# Patient Record
Sex: Female | Born: 1990 | Race: Black or African American | Hispanic: No | Marital: Married | Smoking: Never smoker
Health system: Southern US, Community
[De-identification: ages and names within clinical notes are randomized; demographics above are authoritative.]

## PROBLEM LIST (undated history)

## (undated) ENCOUNTER — Inpatient Hospital Stay (HOSPITAL_COMMUNITY): Payer: Self-pay

## (undated) DIAGNOSIS — Z789 Other specified health status: Secondary | ICD-10-CM

## (undated) DIAGNOSIS — E119 Type 2 diabetes mellitus without complications: Secondary | ICD-10-CM

## (undated) HISTORY — PX: NO PAST SURGERIES: SHX2092

---
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
## 2008-03-12 ENCOUNTER — Emergency Department (HOSPITAL_COMMUNITY): Admission: EM | Admit: 2008-03-12 | Discharge: 2008-03-12 | Payer: Self-pay | Admitting: Emergency Medicine

## 2008-05-23 ENCOUNTER — Emergency Department (HOSPITAL_COMMUNITY): Admission: EM | Admit: 2008-05-23 | Discharge: 2008-05-23 | Payer: Self-pay | Admitting: Emergency Medicine

## 2009-02-13 ENCOUNTER — Emergency Department (HOSPITAL_COMMUNITY): Admission: EM | Admit: 2009-02-13 | Discharge: 2009-02-13 | Payer: Self-pay | Admitting: Family Medicine

## 2010-10-28 LAB — POCT PREGNANCY, URINE: Preg Test, Ur: NEGATIVE

## 2013-01-25 NOTE — L&D Delivery Note (Signed)
Delivery Note At 11:52 AM a viable female was delivered via  (Presentation: ;  ).  APGAR: , ; weight  .   Placenta status: , .  Cord:  with the following complications: .  Cord pH: not done  Anesthesia: None  Episiotomy:   Lacerations:   Suture Repair: 2.0 Est. Blood Loss (mL):    Mom to postpartum.  Baby to Couplet care / Skin to Skin.  Jann Ra A 01/14/2014, 12:02 PM

## 2013-08-15 LAB — OB RESULTS CONSOLE HIV ANTIBODY (ROUTINE TESTING): HIV: NONREACTIVE

## 2013-08-15 LAB — OB RESULTS CONSOLE RUBELLA ANTIBODY, IGM: RUBELLA: IMMUNE

## 2013-08-15 LAB — OB RESULTS CONSOLE GC/CHLAMYDIA
Chlamydia: NEGATIVE
GC PROBE AMP, GENITAL: NEGATIVE

## 2013-08-15 LAB — OB RESULTS CONSOLE ANTIBODY SCREEN: ANTIBODY SCREEN: NEGATIVE

## 2013-08-15 LAB — OB RESULTS CONSOLE RPR: RPR: NONREACTIVE

## 2013-08-15 LAB — OB RESULTS CONSOLE ABO/RH: RH Type: POSITIVE

## 2013-08-15 LAB — OB RESULTS CONSOLE HEPATITIS B SURFACE ANTIGEN: Hepatitis B Surface Ag: NEGATIVE

## 2013-12-17 LAB — OB RESULTS CONSOLE GBS: GBS: POSITIVE

## 2014-01-11 ENCOUNTER — Inpatient Hospital Stay (HOSPITAL_COMMUNITY)
Admission: AD | Admit: 2014-01-11 | Discharge: 2014-01-11 | Disposition: A | Discharge status: Home / Self Care | Payer: Medicaid Other | Source: Ambulatory Visit | Attending: Obstetrics | Admitting: Obstetrics

## 2014-01-11 ENCOUNTER — Encounter (HOSPITAL_COMMUNITY): Payer: Self-pay | Admitting: *Deleted

## 2014-01-11 DIAGNOSIS — O471 False labor at or after 37 completed weeks of gestation: Secondary | ICD-10-CM | POA: Insufficient documentation

## 2014-01-11 DIAGNOSIS — Z3A39 39 weeks gestation of pregnancy: Secondary | ICD-10-CM | POA: Diagnosis not present

## 2014-01-11 HISTORY — DX: Other specified health status: Z78.9

## 2014-01-11 NOTE — MAU Note (Signed)
Contractions all day. Closer now. Unsure if leaking fld or just peeing a lot.

## 2014-01-11 NOTE — Discharge Instructions (Signed)
Nifedipine capsules What is this medicine? NIFEDIPINE (nye FED i peen) is a calcium-channel blocker. It affects the amount of calcium found in your heart and muscle cells. This relaxes your blood vessels, which can reduce the amount of work the heart has to do. This medicine is used to treat chest pain caused by angina. This medicine may be used for other purposes; ask your health care provider or pharmacist if you have questions. COMMON BRAND NAME(S): Adalat, Procardia What should I tell my health care provider before I take this medicine? They need to know if you have any of these conditions: -heart problems, low blood pressure, slow or irregular heartbeat -kidney disease -liver disease -previous heart attack -an unusual or allergic reaction to nifedipine, other medicines, foods, dyes, or preservatives -pregnant or trying to get pregnant -breast-feeding How should I use this medicine? Take this medicine by mouth with a glass of water. Follow the directions on the prescription label. Swallow whole. Take your doses at regular intervals. Do not take your medicine more often then directed. Do not suddenly stop taking this medicine. Your doctor will tell you how much medicine to take. If your doctor wants you to stop the medicine, the dose will be slowly lowered over time to avoid any side effects. Talk to your pediatrician regarding the use of this medicine in children. Special care may be needed. Overdosage: If you think you have taken too much of this medicine contact a poison control center or emergency room at once. NOTE: This medicine is only for you. Do not share this medicine with others. What if I miss a dose? If you miss a dose, take it as soon as you can. If it is almost time for your next dose, take only that dose. Do not take double or extra doses. What may interact with this medicine? Do not take this medicine with any of the following medications: -certain medicines for seizures  like carbamazepine, phenobarbital, phenytoin -rifabutin -rifampin -rifapentine -St. John's Wort This medicine may also interact with the following medications: -antiviral medicines for HIV or AIDS -certain medicines for blood pressure -certain medicines for diabetes -certain medicines for erectile dysfunction -certain medicines for fungal infections like ketoconazole, fluconazole, and itraconazole -certain medicines for irregular heart beat like flecainide and quinidine -certain medicines that treat or prevent blood clots like warfarin -clarithromycin -digoxin -dolasetron -erythromycin -fluoxetine -grapefruit juice -local or general anesthetics -nefazodone -orlistat -quinupristin; dalfopristin -sirolimus -stomach acid blockers like cimetidine, ranitidine, omeprazole, or pantoprazole -tacrolimus -valproic acid This list may not describe all possible interactions. Give your health care provider a list of all the medicines, herbs, non-prescription drugs, or dietary supplements you use. Also tell them if you smoke, drink alcohol, or use illegal drugs. Some items may interact with your medicine. What should I watch for while using this medicine? Visit your doctor or health care professional for regular check ups. Check your blood pressure and pulse rate regularly. Ask your doctor or health care professional what your blood pressure and pulse rate should be and when you should contact him or her. You may get drowsy or dizzy. Do not drive, use machinery, or do anything that needs mental alertness until you know how this medicine affects you. Do not stand or sit up quickly, especially if you are an older patient. This reduces the risk of dizzy or fainting spells. Alcohol may interfere with the effect of this medicine. Avoid alcoholic drinks. What side effects may I notice from receiving this medicine? Side effects  that you should report to your doctor or health care professional as soon as  possible: -blood in the urine -difficulty breathing -fast heartbeat, palpitations, irregular heartbeat, chest pain -redness, blistering, peeling or loosening of the skin, including inside the mouth -reduced amount of urine passed -skin rash -swelling of the legs and ankles Side effects that usually do not require medical attention (report to your doctor or health care professional if they continue or are bothersome): -constipation -facial flushing -headache -weakness or tiredness This list may not describe all possible side effects. Call your doctor for medical advice about side effects. You may report side effects to FDA at 1-800-FDA-1088. Where should I keep my medicine? Keep out of the reach of children. Store at room temperature between 15 and 25 degrees C (59 and 77 degrees F). Protect from light and moisture. Keep container tightly closed. Throw away any unused medicine after the expiration date. NOTE: This sheet is a summary. It may not cover all possible information. If you have questions about this medicine, talk to your doctor, pharmacist, or health care provider.  2015, Elsevier/Gold Standard. (2008-04-03 13:09:21) Preterm Labor Information Preterm labor is when labor starts at less than 37 weeks of pregnancy. The normal length of a pregnancy is 39 to 41 weeks. CAUSES Often, there is no identifiable underlying cause as to why a woman goes into preterm labor. One of the most common known causes of preterm labor is infection. Infections of the uterus, cervix, vagina, amniotic sac, bladder, kidney, or even the lungs (pneumonia) can cause labor to start. Other suspected causes of preterm labor include:   Urogenital infections, such as yeast infections and bacterial vaginosis.   Uterine abnormalities (uterine shape, uterine septum, fibroids, or bleeding from the placenta).   A cervix that has been operated on (it may fail to stay closed).   Malformations in the fetus.    Multiple gestations (twins, triplets, and so on).   Breakage of the amniotic sac.  RISK FACTORS  Having a previous history of preterm labor.   Having premature rupture of membranes (PROM).   Having a placenta that covers the opening of the cervix (placenta previa).   Having a placenta that separates from the uterus (placental abruption).   Having a cervix that is too weak to hold the fetus in the uterus (incompetent cervix).   Having too much fluid in the amniotic sac (polyhydramnios).   Taking illegal drugs or smoking while pregnant.   Not gaining enough weight while pregnant.   Being younger than 8718 and older than 25 years old.   Having a low socioeconomic status.   Being African American. SYMPTOMS Signs and symptoms of preterm labor include:   Menstrual-like cramps, abdominal pain, or back pain.  Uterine contractions that are regular, as frequent as six in an hour, regardless of their intensity (may be mild or painful).  Contractions that start on the top of the uterus and spread down to the lower abdomen and back.   A sense of increased pelvic pressure.   A watery or bloody mucus discharge that comes from the vagina.  TREATMENT Depending on the length of the pregnancy and other circumstances, your health care provider may suggest bed rest. If necessary, there are medicines that can be given to stop contractions and to mature the fetal lungs. If labor happens before 34 weeks of pregnancy, a prolonged hospital stay may be recommended. Treatment depends on the condition of both you and the fetus.  WHAT SHOULD YOU  DO IF YOU THINK YOU ARE IN PRETERM LABOR? Call your health care provider right away. You will need to go to the hospital to get checked immediately. HOW CAN YOU PREVENT PRETERM LABOR IN FUTURE PREGNANCIES? You should:   Stop smoking if you smoke.  Maintain healthy weight gain and avoid chemicals and drugs that are not necessary.  Be  watchful for any type of infection.  Inform your health care provider if you have a known history of preterm labor. Document Released: 04/03/2003 Document Revised: 09/13/2012 Document Reviewed: 02/14/2012 [facility] Patient Information 2015 [place]. This information is not intended to replace advice given to you by your health care provider. Make sure you discuss any questions you have with your health care provider.

## 2014-01-14 ENCOUNTER — Inpatient Hospital Stay (HOSPITAL_COMMUNITY)
Admission: AD | Admit: 2014-01-14 | Discharge: 2014-01-16 | DRG: 775 | Disposition: A | Discharge status: Home / Self Care | Payer: Medicaid Other | Source: Ambulatory Visit | Attending: Obstetrics | Admitting: Obstetrics

## 2014-01-14 ENCOUNTER — Encounter (HOSPITAL_COMMUNITY): Payer: Self-pay

## 2014-01-14 DIAGNOSIS — O99820 Streptococcus B carrier state complicating pregnancy: Secondary | ICD-10-CM | POA: Diagnosis present

## 2014-01-14 DIAGNOSIS — Z3A40 40 weeks gestation of pregnancy: Secondary | ICD-10-CM | POA: Diagnosis present

## 2014-01-14 DIAGNOSIS — O480 Post-term pregnancy: Secondary | ICD-10-CM | POA: Diagnosis present

## 2014-01-14 DIAGNOSIS — O99824 Streptococcus B carrier state complicating childbirth: Secondary | ICD-10-CM | POA: Diagnosis present

## 2014-01-14 LAB — CBC
HCT: 30.6 % — ABNORMAL LOW (ref 36.0–46.0)
Hemoglobin: 9.7 g/dL — ABNORMAL LOW (ref 12.0–15.0)
MCH: 25.5 pg — ABNORMAL LOW (ref 26.0–34.0)
MCHC: 31.7 g/dL (ref 30.0–36.0)
MCV: 80.5 fL (ref 78.0–100.0)
PLATELETS: 235 10*3/uL (ref 150–400)
RBC: 3.8 MIL/uL — ABNORMAL LOW (ref 3.87–5.11)
RDW: 14.5 % (ref 11.5–15.5)
WBC: 8.4 10*3/uL (ref 4.0–10.5)

## 2014-01-14 LAB — RPR

## 2014-01-14 LAB — TYPE AND SCREEN
ABO/RH(D): O POS
Antibody Screen: NEGATIVE

## 2014-01-14 LAB — ABO/RH: ABO/RH(D): O POS

## 2014-01-14 MED ORDER — FENTANYL 2.5 MCG/ML BUPIVACAINE 1/10 % EPIDURAL INFUSION (WH - ANES): 14.0000 mL/h | INTRAMUSCULAR | Status: DC | PRN

## 2014-01-14 MED ORDER — CITRIC ACID-SODIUM CITRATE 334-500 MG/5ML PO SOLN: 30.0000 mL | ORAL | Status: DC | PRN

## 2014-01-14 MED ORDER — FERROUS SULFATE 325 (65 FE) MG PO TABS
325.0000 mg | ORAL_TABLET | Freq: Two times a day (BID) | ORAL | Status: DC
  Administered 2014-01-14 – 2014-01-15 (×2): 325 mg via ORAL
  Filled 2014-01-14 (×5): qty 1

## 2014-01-14 MED ORDER — ONDANSETRON HCL 4 MG/2ML IJ SOLN: 4.0000 mg | INTRAMUSCULAR | Status: DC | PRN

## 2014-01-14 MED ORDER — BENZOCAINE-MENTHOL 20-0.5 % EX AERO
1.0000 "application " | INHALATION_SPRAY | CUTANEOUS | Status: DC | PRN
  Filled 2014-01-14: qty 56

## 2014-01-14 MED ORDER — OXYCODONE-ACETAMINOPHEN 5-325 MG PO TABS: 2.0000 | ORAL_TABLET | ORAL | Status: DC | PRN

## 2014-01-14 MED ORDER — OXYTOCIN 40 UNITS IN LACTATED RINGERS INFUSION - SIMPLE MED
1.0000 m[IU]/min | INTRAVENOUS | Status: DC
  Administered 2014-01-14: 2 m[IU]/min via INTRAVENOUS

## 2014-01-14 MED ORDER — LACTATED RINGERS IV SOLN: 500.0000 mL | INTRAVENOUS | Status: DC | PRN

## 2014-01-14 MED ORDER — ACETAMINOPHEN 325 MG PO TABS: 650.0000 mg | ORAL_TABLET | ORAL | Status: DC | PRN

## 2014-01-14 MED ORDER — OXYTOCIN BOLUS FROM INFUSION
500.0000 mL | INTRAVENOUS | Status: DC
  Administered 2014-01-14: 500 mL via INTRAVENOUS

## 2014-01-14 MED ORDER — TERBUTALINE SULFATE 1 MG/ML IJ SOLN: 0.2500 mg | Freq: Once | INTRAMUSCULAR | Status: DC | PRN

## 2014-01-14 MED ORDER — PHENYLEPHRINE 40 MCG/ML (10ML) SYRINGE FOR IV PUSH (FOR BLOOD PRESSURE SUPPORT): 80.0000 ug | PREFILLED_SYRINGE | INTRAVENOUS | Status: DC | PRN

## 2014-01-14 MED ORDER — EPHEDRINE 5 MG/ML INJ: 10.0000 mg | INTRAVENOUS | Status: DC | PRN

## 2014-01-14 MED ORDER — OXYCODONE-ACETAMINOPHEN 5-325 MG PO TABS
1.0000 | ORAL_TABLET | ORAL | Status: DC | PRN
  Administered 2014-01-15: 1 via ORAL
  Filled 2014-01-14: qty 1

## 2014-01-14 MED ORDER — IBUPROFEN 600 MG PO TABS
600.0000 mg | ORAL_TABLET | Freq: Four times a day (QID) | ORAL | Status: DC
  Administered 2014-01-14 – 2014-01-16 (×8): 600 mg via ORAL
  Filled 2014-01-14 (×9): qty 1

## 2014-01-14 MED ORDER — LACTATED RINGERS IV SOLN
INTRAVENOUS | Status: DC
  Administered 2014-01-14: 05:00:00 via INTRAVENOUS

## 2014-01-14 MED ORDER — DIBUCAINE 1 % RE OINT
1.0000 "application " | TOPICAL_OINTMENT | RECTAL | Status: DC | PRN
  Filled 2014-01-14: qty 28

## 2014-01-14 MED ORDER — BUTORPHANOL TARTRATE 1 MG/ML IJ SOLN: 1.0000 mg | INTRAMUSCULAR | Status: DC | PRN

## 2014-01-14 MED ORDER — SIMETHICONE 80 MG PO CHEW: 80.0000 mg | CHEWABLE_TABLET | ORAL | Status: DC | PRN

## 2014-01-14 MED ORDER — ZOLPIDEM TARTRATE 5 MG PO TABS
5.0000 mg | ORAL_TABLET | Freq: Every evening | ORAL | Status: DC | PRN
Start: 2014-01-14 — End: 2014-01-16

## 2014-01-14 MED ORDER — SODIUM CHLORIDE 0.9 % IV SOLN
2.0000 g | Freq: Four times a day (QID) | INTRAVENOUS | Status: DC
  Administered 2014-01-14 (×2): 2 g via INTRAVENOUS
  Filled 2014-01-14 (×2): qty 2000

## 2014-01-14 MED ORDER — FLEET ENEMA 7-19 GM/118ML RE ENEM: 1.0000 | ENEMA | RECTAL | Status: DC | PRN

## 2014-01-14 MED ORDER — ONDANSETRON HCL 4 MG PO TABS: 4.0000 mg | ORAL_TABLET | ORAL | Status: DC | PRN

## 2014-01-14 MED ORDER — LACTATED RINGERS IV SOLN: 500.0000 mL | Freq: Once | INTRAVENOUS | Status: DC

## 2014-01-14 MED ORDER — DIPHENHYDRAMINE HCL 25 MG PO CAPS: 25.0000 mg | ORAL_CAPSULE | Freq: Four times a day (QID) | ORAL | Status: DC | PRN

## 2014-01-14 MED ORDER — ONDANSETRON HCL 4 MG/2ML IJ SOLN: 4.0000 mg | Freq: Four times a day (QID) | INTRAMUSCULAR | Status: DC | PRN

## 2014-01-14 MED ORDER — SENNOSIDES-DOCUSATE SODIUM 8.6-50 MG PO TABS
2.0000 | ORAL_TABLET | ORAL | Status: DC
  Administered 2014-01-15: 2 via ORAL
  Filled 2014-01-14 (×2): qty 2
  Filled 2014-01-14: qty 1

## 2014-01-14 MED ORDER — WITCH HAZEL-GLYCERIN EX PADS: 1.0000 "application " | MEDICATED_PAD | CUTANEOUS | Status: DC | PRN

## 2014-01-14 MED ORDER — INFLUENZA VAC SPLIT QUAD 0.5 ML IM SUSY
0.5000 mL | PREFILLED_SYRINGE | INTRAMUSCULAR | Status: AC
  Administered 2014-01-15: 0.5 mL via INTRAMUSCULAR
  Filled 2014-01-14: qty 0.5

## 2014-01-14 MED ORDER — OXYCODONE-ACETAMINOPHEN 5-325 MG PO TABS
1.0000 | ORAL_TABLET | ORAL | Status: DC | PRN
  Administered 2014-01-14: 1 via ORAL
  Filled 2014-01-14: qty 1

## 2014-01-14 MED ORDER — LANOLIN HYDROUS EX OINT: TOPICAL_OINTMENT | CUTANEOUS | Status: DC | PRN

## 2014-01-14 MED ORDER — OXYTOCIN 40 UNITS IN LACTATED RINGERS INFUSION - SIMPLE MED
62.5000 mL/h | INTRAVENOUS | Status: DC
  Administered 2014-01-14: 62.5 mL/h via INTRAVENOUS
  Filled 2014-01-14: qty 1000

## 2014-01-14 MED ORDER — PRENATAL MULTIVITAMIN CH
1.0000 | ORAL_TABLET | Freq: Every day | ORAL | Status: DC
  Administered 2014-01-15 – 2014-01-16 (×2): 1 via ORAL
  Filled 2014-01-14 (×2): qty 1

## 2014-01-14 MED ORDER — LIDOCAINE HCL (PF) 1 % IJ SOLN
30.0000 mL | INTRAMUSCULAR | Status: DC | PRN
  Filled 2014-01-14: qty 30

## 2014-01-14 MED ORDER — TETANUS-DIPHTH-ACELL PERTUSSIS 5-2.5-18.5 LF-MCG/0.5 IM SUSP
0.5000 mL | Freq: Once | INTRAMUSCULAR | Status: DC
  Filled 2014-01-14: qty 0.5

## 2014-01-14 MED ORDER — DIPHENHYDRAMINE HCL 50 MG/ML IJ SOLN: 12.5000 mg | INTRAMUSCULAR | Status: DC | PRN

## 2014-01-14 NOTE — MAU Note (Signed)
Pt states she has been contracting since 1 am with increasing pain. Pt denies any bleeding or leaking of fluid.

## 2014-01-14 NOTE — Progress Notes (Signed)
Patient ID: Melissa Chan, female   DOB: 1990-10-06, 23 y.o.   MRN: [id number] She is now  8 cm  centimeters 90% and 0 station

## 2014-01-14 NOTE — Progress Notes (Signed)
UR chart review completed.  

## 2014-01-14 NOTE — Lactation Note (Signed)
This note was copied from the chart of Melissa Tawni PummelShaquanna Staub. Lactation Consultation Note  Patient Name: Melissa Chan ZOXWR'UToday's Date: 01/14/2014 Reason for consult: Initial assessment  Per mom the baby fed after delivery for 10 mins , and recently fed 10 ml of formula, ( moms choice) . LC noted the baby still to be hungry. LC placed baby skin to skin and assisted with latch , depth achieved  and multiply swallows noted, increased with breast compressions. Baby fed for 8 mins and released on her own .  LC reviewed basics - hand expressing ( steady flow of colostrum noted ). LC discussed supply and demand  And encouraged and recommended to mom to always feed baby at the breast 1st ,and offer 2nd breast. If the baby is satisfied  Hold off on the supplementing. After the baby finished at he breast. Baby placed skin to skin and a blanket on top. Mother informed of post-discharge support and given phone number to the lactation department, including services for phone call assistance;  out-patient appointments; and breastfeeding support group. List of other breastfeeding resources in the community given in the handout. Encouraged  mother to call for problems or concerns related to breastfeeding.     Maternal Data Has patient been taught Hand Expression?: Yes (steady flow of colostrum noted ) Does the patient have breastfeeding experience prior to this delivery?: Yes  Feeding Feeding Type: Breast Fed (left breast ) Nipple Type: Slow - flow Length of feed: 8 min (L:C observed , multiply swallows noted )  LATCH Score/Interventions Latch: Grasps breast easily, tongue down, lips flanged, rhythmical sucking.  Audible Swallowing: Spontaneous and intermittent  Type of Nipple: Everted at rest and after stimulation  Comfort (Breast/Nipple): Soft / non-tender     Hold (Positioning): Assistance needed to correctly position infant at breast and maintain latch. 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

## 2014-01-14 NOTE — H&P (Signed)
This is Dr. Francoise CeoBernard Travarius Lange dictating the history and physical on  Melissa Chan she's a 23 year old gravida 4 para 02/25/2001 at 40 weeks EDC 1221 positive GBS she's in labor cervix 4-5 cm 70% vertex -2 amniotomy performed the fluids clear and she received ampicillin for a positive GBS she is contracting every 3-4 minutes Past medical history negative Past surgical history negative Social history negative System review negative Physical exam well-developed female in early labor HEENT negative Lungs clear to P&A Heart regular rhythm no murmurs no gallops Breasts negative Lungs clear to P&A Abdomen term Pelvic as described above Extremities negative

## 2014-01-15 LAB — CBC
HCT: 28.3 % — ABNORMAL LOW (ref 36.0–46.0)
Hemoglobin: 9 g/dL — ABNORMAL LOW (ref 12.0–15.0)
MCH: 25.7 pg — ABNORMAL LOW (ref 26.0–34.0)
MCHC: 31.8 g/dL (ref 30.0–36.0)
MCV: 80.9 fL (ref 78.0–100.0)
Platelets: 214 10*3/uL (ref 150–400)
RBC: 3.5 MIL/uL — ABNORMAL LOW (ref 3.87–5.11)
RDW: 14.6 % (ref 11.5–15.5)
WBC: 12.9 10*3/uL — ABNORMAL HIGH (ref 4.0–10.5)

## 2014-01-15 NOTE — Lactation Note (Signed)
This note was copied from the chart of Melissa Tawni PummelShaquanna Spurgin. Lactation Consultation Note  Patient Name: Melissa Chan ZOXWR'UToday's Date: 01/15/2014 Reason for consult: Follow-up assessment of this mom and baby, now 3 hours pp.  Mom has been primarily bottle-feeding with formula but states that baby is still latching well at some attempts.  Baby is taking up to 25 ml's of formula now.  LC reviewed LEAD cautions and encouraged mom to breastfeed first and call for help as needed.  Mom planned to both breastfeed and formula feed and continues stating that this is her choice.   Maternal Data Formula Feeding for Exclusion: Yes Reason for exclusion: Mother's choice to formula and breast feed on admission  Feeding    LATCH Score/Interventions      LATCH score=9 last evening (only 2 breastfeedings since then and no LATCH score assessed                Lactation Tools Discussed/Used   LEAD cautions Encouraged breastfeeding on cue and mom to call for help as needed  Consult Status Consult Status: Follow-up Date: 01/16/14 Follow-up type: In-patient    Melissa Chan, Melissa Chan [facility] 01/15/2014, 9:06 PM

## 2014-01-15 NOTE — Progress Notes (Signed)
Patient ID: Melissa Chan, female   DOB: 10-20-1990, 23 y.o.   MRN: [id number] Postpartum day one Vital signs normal Fundus firm Lochia moderate Legs negative doing well

## 2014-01-16 NOTE — Discharge Summary (Signed)
Obstetric Discharge Summary Reason for Admission: onset of labor Prenatal Procedures: none Intrapartum Procedures: spontaneous vaginal delivery Postpartum Procedures: none Complications-Operative and Postpartum: none HEMOGLOBIN  Date Value Ref Range Status  01/15/2014 9.0* 12.0 - 15.0 g/dL Final   HCT  Date Value Ref Range Status  01/15/2014 28.3* 36.0 - 46.0 % Final    Physical Exam:  General: alert Lochia: appropriate Uterine Fundus: firm Incision: healing well DVT Evaluation: No evidence of DVT seen on physical exam.  Discharge Diagnoses: Term Pregnancy-delivered  Discharge Information: Date: 01/16/2014 Activity: pelvic rest Diet: routine Medications: Percocet Condition: stable Instructions: refer to practice specific booklet Discharge to: home Follow-up Information    Follow up with Melissa Chan,Melissa Fish Chan, Melissa Chan.   Specialty:  Obstetrics and Gynecology   Contact information:   [address] [place] [id number] [phone]       Newborn Data: Live born female  Birth Weight: 7 lb 8.8 oz (3425 g) APGAR: 9, 9  Home with mother.  Melissa Chan 01/16/2014, 5:38 AM

## 2014-01-16 NOTE — Discharge Instructions (Signed)
Discharge instructions   You can wash your hair  Shower  Eat what you want  Drink what you want  See me in 6 weeks  Your ankles are going to swell more in the next 2 weeks than when pregnant  No sex for 6 weeks   Bertil Brickey A, MD 01/16/2014

## 2014-01-16 NOTE — Progress Notes (Signed)
Patient ID: Melissa Chan, female   DOB: 07-05-1990, 23 y.o.   MRN: [id number] Postpartum day 2 Vital signs normal Fundus firm Lochia moderate Legs negative doing well

## 2014-01-16 NOTE — Lactation Note (Signed)
This note was copied from the chart of Melissa Tawni PummelShaquanna Carley. Lactation Consultation Note   Baby patient due to increasing bilirubin.  Encouraged mother to breastfeeding often to stimulate her milk supply. Mom encouraged to feed baby 8-12 times/24 hours and with feeding cues.  Mother placed baby in fb hold.  Sucks and swallows observed, some with massage. Discussed engorgement care.  Mother has been watching voids/stools chart in Baby & Me booklet.  Patient Name: Melissa Chan Reason for consult: Follow-up assessment   Maternal Data    Feeding Feeding Type: Breast Fed  LATCH Score/Interventions Latch: Grasps breast easily, tongue down, lips flanged, rhythmical sucking.  Audible Swallowing: A few with stimulation  Type of Nipple: Everted at rest and after stimulation  Comfort (Breast/Nipple): Soft / non-tender     Hold (Positioning): Assistance needed to correctly position infant at breast and maintain latch.  LATCH Score: 8  Lactation Tools Discussed/Used     Consult Status Consult Status: Follow-up Date: 01/17/14 Follow-up type: In-patient    Melissa Chan, Melissa Chan [facility], 11:33 AM

## 2014-01-17 ENCOUNTER — Ambulatory Visit: Payer: Self-pay

## 2014-01-17 NOTE — Lactation Note (Signed)
This note was copied from the chart of Melissa Tawni PummelShaquanna Gass. Lactation Consultation Note  Patient Name: Melissa Chan UJWJX'BToday's Date: 01/17/2014 Reason for consult: Follow-up assessment  Baby is 2069 hours old and breast feeding well and mom has been supplementing. Mom milk is in , beast full , no engorgement . LC discussed supply and demand , And the importance of breastfeeding frequently to prevent engorgement . Mom has a hand pump to  For D/C and per mom has been instructed. LC encouraged mom to use Baby and me booklet for resource. Per mom nipples are tender , no breakdown noted, instructed on use comfort gels.  Mother informed of post-discharge support and given phone number to the lactation department, including services for phone call assistance; out-patient appointments; and breastfeeding support group. List of other breastfeeding resources in the community given in the handout. Encouraged mother to call for problems or concerns related to breastfeeding.    Maternal Data    Feeding Feeding Type: Breast Fed Length of feed: 12 min  LATCH Score/Interventions Latch: Grasps breast easily, tongue down, lips flanged, rhythmical sucking.  Audible Swallowing: Spontaneous and intermittent  Type of Nipple: Everted at rest and after stimulation  Comfort (Breast/Nipple): Filling, red/small blisters or bruises, mild/mod discomfort  Problem noted: Mild/Moderate discomfort Interventions (Mild/moderate discomfort): Hand expression  Hold (Positioning): No assistance needed to correctly position infant at breast. Intervention(s): Breastfeeding basics reviewed  LATCH Score: 9  Lactation Tools Discussed/Used     Consult Status Consult Status: Complete Date: 01/17/14 Follow-up type: In-patient    [name], Raaga Maeder Ann 01/17/2014, 9:19 AM

## 2014-12-13 ENCOUNTER — Emergency Department (HOSPITAL_COMMUNITY)
Admission: EM | Admit: 2014-12-13 | Discharge: 2014-12-13 | Disposition: A | Discharge status: Home / Self Care | Payer: Medicaid Other | Attending: Emergency Medicine | Admitting: Emergency Medicine

## 2014-12-13 ENCOUNTER — Emergency Department (HOSPITAL_COMMUNITY): Payer: Medicaid Other

## 2014-12-13 ENCOUNTER — Encounter (HOSPITAL_COMMUNITY): Payer: Self-pay | Admitting: *Deleted

## 2014-12-13 DIAGNOSIS — J069 Acute upper respiratory infection, unspecified: Secondary | ICD-10-CM | POA: Insufficient documentation

## 2014-12-13 DIAGNOSIS — R079 Chest pain, unspecified: Secondary | ICD-10-CM | POA: Insufficient documentation

## 2014-12-13 DIAGNOSIS — Z792 Long term (current) use of antibiotics: Secondary | ICD-10-CM | POA: Diagnosis not present

## 2014-12-13 DIAGNOSIS — L02411 Cutaneous abscess of right axilla: Secondary | ICD-10-CM | POA: Diagnosis not present

## 2014-12-13 DIAGNOSIS — J209 Acute bronchitis, unspecified: Secondary | ICD-10-CM | POA: Diagnosis not present

## 2014-12-13 DIAGNOSIS — R05 Cough: Secondary | ICD-10-CM | POA: Diagnosis present

## 2014-12-13 DIAGNOSIS — B9789 Other viral agents as the cause of diseases classified elsewhere: Secondary | ICD-10-CM

## 2014-12-13 DIAGNOSIS — L02818 Cutaneous abscess of other sites: Secondary | ICD-10-CM

## 2014-12-13 MED ORDER — ALBUTEROL SULFATE (2.5 MG/3ML) 0.083% IN NEBU
5.0000 mg | INHALATION_SOLUTION | Freq: Once | RESPIRATORY_TRACT | Status: AC
  Administered 2014-12-13: 5 mg via RESPIRATORY_TRACT
  Filled 2014-12-13: qty 6

## 2014-12-13 MED ORDER — SULFAMETHOXAZOLE-TRIMETHOPRIM 800-160 MG PO TABS: 1.0000 | ORAL_TABLET | Freq: Two times a day (BID) | ORAL | Status: AC

## 2014-12-13 MED ORDER — IPRATROPIUM BROMIDE 0.02 % IN SOLN
0.5000 mg | Freq: Once | RESPIRATORY_TRACT | Status: AC
  Administered 2014-12-13: 0.5 mg via RESPIRATORY_TRACT
  Filled 2014-12-13: qty 2.5

## 2014-12-13 MED ORDER — BENZONATATE 100 MG PO CAPS: 100.0000 mg | ORAL_CAPSULE | Freq: Three times a day (TID) | ORAL | Status: DC

## 2014-12-13 MED ORDER — ALBUTEROL SULFATE HFA 108 (90 BASE) MCG/ACT IN AERS
2.0000 | INHALATION_SPRAY | RESPIRATORY_TRACT | Status: DC | PRN
  Administered 2014-12-13: 2 via RESPIRATORY_TRACT
  Filled 2014-12-13: qty 6.7

## 2014-12-13 MED ORDER — AEROCHAMBER PLUS FLO-VU MEDIUM MISC
1.0000 | Freq: Once | Status: DC
  Filled 2014-12-13: qty 1

## 2014-12-13 MED ORDER — CEPHALEXIN 500 MG PO CAPS: 500.0000 mg | ORAL_CAPSULE | Freq: Four times a day (QID) | ORAL | Status: DC

## 2014-12-13 NOTE — Discharge Instructions (Signed)
1. Medications: albuterol, OTC mucinex, tessalon, keflex, bactrim, usual home medications 2. Treatment: rest, drink plenty of fluids, take tylenol or ibuprofen for fever control 3. Follow Up: Please followup with your primary doctor in 3 days for discussion of your diagnoses and further evaluation after today's visit; if you do not have a primary care doctor use the resource guide provided to find one; Return to the ER for high fevers, difficulty breathing or other concerning symptoms    Acute Bronchitis Bronchitis is inflammation of the airways that extend from the windpipe into the lungs (bronchi). The inflammation often causes mucus to develop. This leads to a cough, which is the most common symptom of bronchitis.  In acute bronchitis, the condition usually develops suddenly and goes away over time, usually in a couple weeks. Smoking, allergies, and asthma can make bronchitis worse. Repeated episodes of bronchitis may cause further lung problems.  CAUSES Acute bronchitis is most often caused by the same virus that causes a cold. The virus can spread from person to person (contagious) through coughing, sneezing, and touching contaminated objects. SIGNS AND SYMPTOMS   Cough.   Fever.   Coughing up mucus.   Body aches.   Chest congestion.   Chills.   Shortness of breath.   Sore throat.  DIAGNOSIS  Acute bronchitis is usually diagnosed through a physical exam. Your health care provider will also ask you questions about your medical history. Tests, such as chest X-rays, are sometimes done to rule out other conditions.  TREATMENT  Acute bronchitis usually goes away in a couple weeks. Oftentimes, no medical treatment is necessary. Medicines are sometimes given for relief of fever or cough. Antibiotic medicines are usually not needed but may be prescribed in certain situations. In some cases, an inhaler may be recommended to help reduce shortness of breath and control the cough. A  cool mist vaporizer may also be used to help thin bronchial secretions and make it easier to clear the chest.  HOME CARE INSTRUCTIONS  Get plenty of rest.   Drink enough fluids to keep your urine clear or pale yellow (unless you have a medical condition that requires fluid restriction). Increasing fluids may help thin your respiratory secretions (sputum) and reduce chest congestion, and it will prevent dehydration.   Take medicines only as directed by your health care provider.  If you were prescribed an antibiotic medicine, finish it all even if you start to feel better.  Avoid smoking and secondhand smoke. Exposure to cigarette smoke or irritating chemicals will make bronchitis worse. If you are a smoker, consider using nicotine gum or skin patches to help control withdrawal symptoms. Quitting smoking will help your lungs heal faster.   Reduce the chances of another bout of acute bronchitis by washing your hands frequently, avoiding people with cold symptoms, and trying not to touch your hands to your mouth, nose, or eyes.   Keep all follow-up visits as directed by your health care provider.  SEEK MEDICAL CARE IF: Your symptoms do not improve after 1 week of treatment.  SEEK IMMEDIATE MEDICAL CARE IF:  You develop an increased fever or chills.   You have chest pain.   You have severe shortness of breath.  You have bloody sputum.   You develop dehydration.  You faint or repeatedly feel like you are going to pass out.  You develop repeated vomiting.  You develop a severe headache. MAKE SURE YOU:   Understand these instructions.  Will watch your condition.  Will  get help right away if you are not doing well or get worse.   This information is not intended to replace advice given to you by your health care provider. Make sure you discuss any questions you have with your health care provider.   Document Released: 02/19/2004 Document Revised: 02/01/2014 Document  Reviewed: 07/04/2012 Elsevier Interactive Patient Education 2016 Elsevier Inc.   Abscess An abscess is an infected area that contains a collection of pus and debris.It can occur in almost any part of the body. An abscess is also known as a furuncle or boil. CAUSES  An abscess occurs when tissue gets infected. This can occur from blockage of oil or sweat glands, infection of hair follicles, or a minor injury to the skin. As the body tries to fight the infection, pus collects in the area and creates pressure under the skin. This pressure causes pain. People with weakened immune systems have difficulty fighting infections and get certain abscesses more often.  SYMPTOMS Usually an abscess develops on the skin and becomes a painful mass that is red, warm, and tender. If the abscess forms under the skin, you may feel a moveable soft area under the skin. Some abscesses break open (rupture) on their own, but most will continue to get worse without care. The infection can spread deeper into the body and eventually into the bloodstream, causing you to feel ill.  DIAGNOSIS  Your caregiver will take your medical history and perform a physical exam. A sample of fluid may also be taken from the abscess to determine what is causing your infection. TREATMENT  Your caregiver may prescribe antibiotic medicines to fight the infection. However, taking antibiotics alone usually does not cure an abscess. Your caregiver may need to make a small cut (incision) in the abscess to drain the pus. In some cases, gauze is packed into the abscess to reduce pain and to continue draining the area. HOME CARE INSTRUCTIONS   Only take over-the-counter or prescription medicines for pain, discomfort, or fever as directed by your caregiver.  If you were prescribed antibiotics, take them as directed. Finish them even if you start to feel better.  If gauze is used, follow your caregiver's directions for changing the gauze.  To avoid  spreading the infection:  Keep your draining abscess covered with a bandage.  Wash your hands well.  Do not share personal care items, towels, or whirlpools with others.  Avoid skin contact with others.  Keep your skin and clothes clean around the abscess.  Keep all follow-up appointments as directed by your caregiver. SEEK MEDICAL CARE IF:   You have increased pain, swelling, redness, fluid drainage, or bleeding.  You have muscle aches, chills, or a general ill feeling.  You have a fever. MAKE SURE YOU:   Understand these instructions.  Will watch your condition.  Will get help right away if you are not doing well or get worse.   This information is not intended to replace advice given to you by your health care provider. Make sure you discuss any questions you have with your health care provider.   Document Released: 10/21/2004 Document Revised: 07/13/2011 Document Reviewed: 03/26/2011 Elsevier Interactive Patient Education 2016 [employer].   Emergency Department Resource Guide 1) Find a Doctor and Pay Out of Pocket Although you won't have to find out who is covered by your insurance plan, it is a good idea to ask around and get recommendations. You will then need to call the office and see  if the doctor you have chosen will accept you as a new patient and what types of options they offer for patients who are self-pay. Some doctors offer discounts or will set up payment plans for their patients who do not have insurance, but you will need to ask so you aren't surprised when you get to your appointment.  2) Contact Your Local Health Department Not all health departments have doctors that can see patients for sick visits, but many do, so it is worth a call to see if yours does. If you don't know where your local health department is, you can check in your phone book. The CDC also has a tool to help you locate your state's health department, and many state websites also have  listings of all of their local health departments.  3) Find a Walk-in Clinic If your illness is not likely to be very severe or complicated, you may want to try a walk in clinic. These are popping up all over the country in pharmacies, drugstores, and shopping centers. They're usually staffed by nurse practitioners or physician assistants that have been trained to treat common illnesses and complaints. They're usually fairly quick and inexpensive. However, if you have serious medical issues or chronic medical problems, these are probably not your best option.  No Primary Care Doctor: - Call Health Connect at  [phone] - they can help you locate a primary care doctor that  accepts your insurance, provides certain services, etc. - Physician Referral Service- [phone]  Chronic Pain Problems: Organization         Address  Phone   Notes  [facility]  [phone] Patients need to be referred by their primary care doctor.   Medication Assistance: Organization         Address  Phone   Notes  [facility] [address] [place], [place] [address] [phone] --Must be a resident of [facility] [place] [facility] [place] -- Must have NO insurance coverage whatsoever (no Medicaid/ Medicare, etc.) -- The pt. MUST have a primary care doctor that directs their care regularly and follows them in the community   [facility]ist  [phone]   [employer]  [phone]    Agencies that provide inexpensive medical care: Organization         Address  Phone   Notes  [facility]  [phone]   [facility]    [phone]   [facility] [address] [place], [place] [address] [phone]   [facility] [address], [place] [phone]   [facility]    [phone]   [facility]    [phone]   [facility]  [address], [place] Phone:  [phone], Fax:  [phone] Hours of Operation:  9 am - 6 pm, M-F.  Also accepts Medicaid/Medicare and self-pay.  [facility]  [address], [place] Phone: [phone], Fax: [phone]) [phone]. Hours of Operation:  8:30 am - 5:30 pm, M-F.  Also accepts Medicaid and self-pay.  [facility] [place] [address], [place] Phone: [phone]   [facility] [address], [place], [place] [phone], Ext. [phone] Mondays & Thursdays: 7-9 AM.  First 15 patients are seen on a first come, first serve basis.    Medicaid-accepting [facility] Providers:  Organization         Address  Phone   Notes  [facility] [address], [place] [phone] Also accepts self-pay patients.  [facility] [address] Laurell Josephs [place], [place]  [phone]   [facility] [address], [place] [phone]   [facility] [address] [place] [phone]   Renaye Rakers [address], [place]   [phone] Only accepts [place] Access [place] patients after they have their name applied to their card.   Self-Pay (no insurance) in [facility]:  Organization         Address  Phone   Notes  Sickle Cell Patients, [facility] [address] [place], [place] [phone]   [facility] [address] [place], [place] [phone]   [facility]  [address], [place] [phone]   [facility]/Dr. Osei-Bonsu  [address] [place] or [address], [place] [phone] Phone number for both [place] and [place] locations is the same.  [facility]gent Medical and [facility] [address], [place] [phone]   [facility] [address]  [place] or [address] [phone]   [facility] [address], [place] [phone], phone; [phone], fax Sees patients 1st and 3rd Saturday of every month.  Must not qualify for public or private insurance (i.e. Medicaid, Medicare, [place] Health Choice, Veterans' Benefits)  Household income should be no more than 200% of the poverty level The clinic cannot treat you if you are pregnant or think you are pregnant  Sexually transmitted diseases are not treated at the clinic.    Dental Care: Organization         Address  Phone  Notes  [facility] [address] [place], [place] [phone] Accepts children up to age 69 who are enrolled in [place] or [place] Health Choice; pregnant women with a Medicaid card; and children who have applied for Medicaid or [place] Health Choice, but were declined, whose parents can pay a reduced fee at time of service.  [facility]  [address], [place] [phone] Accepts children up to age 77 who are enrolled in [place] or [place] Health Choice; pregnant women with a Medicaid card; and children who have applied for Medicaid or [place] Health Choice, but were declined, whose parents can pay a reduced fee at time of service.  [facility]  [address] [place], [place] [phone] Patients are seen by appointment only. Walk-ins are not accepted. [facility] will see patients 83 years of age and older. Monday - Tuesday (8am-5pm) Most Wednesdays (8:30-5pm) $30 per visit, cash only  [facility] Access PRO[facility]  [address], [facility] [phone] Patients are seen by appointment only. Walk-ins are not accepted. [facility] will see patients 42 years of age and older. One Wednesday Evening (Monthly: Volunteer Based).  $30 per visit, cash only  [employer]  [phone] for adults; Children under age 31, call Graduate Pediatric Dentistry at [phone]. Children aged 109-14, please call [phone] to request a pediatric application.  Dental services  are provided in all areas of dental care including fillings, crowns and bridges, complete and partial dentures, implants, gum treatment, root canals, and extractions. Preventive care is also provided. Treatment is provided to both adults and children. Patients are selected via a lottery and there is often a waiting list.   [facility] [address], [place]  [phone] [email or web address]   [facility] [address] [place], [place] [phone], Ext. [phone] Second and Fourth Thursday of each month, opens at 6:30 AM; Clinic ends at 9 AM.  Patients are seen on a first-come first-served basis, and a limited number are seen during each clinic.   [facility]  [address] Ether Griffins [place], [place] [phone]   Eligibility Requirements You must have lived in [place], [place], or [place] counties for at least the last three months.   You cannot be eligible for state or federal sponsored [place], including [employer], [place], or [employer].   You generally cannot be eligible for healthcare insurance through your employer.    How to apply: Eligibility screenings are held every Tuesday and Wednesday afternoon from 1:00 pm until 4:00 pm. You do not need an appointment for the interview!  [facility] [address] [place], [place] [phone]   [facility] Health Department  [phone]   [facility] Health Department  [phone]   [facility] Health Department  [phone]    Behavioral Health Resources in the Community: Intensive Outpatient Programs Organization         Address  Phone  Notes  [facility] [address], [place], [place] [phone]     [facility] [address], [place], [place] [phone]   [facility] [address] [place], [place]  [phone]   [facility] Mental Health [address],  [place], [place] [phone] or [phone]   Substance Abuse Resources Organization         Address  Phone  Notes  [facility] Drug [facility]  [phone]   [facility]  [phone]   [facility]  [phone]   Floydene Flock  [phone]   Residential & Outpatient Substance Abuse Program  [phone]   Psychological Services Organization         Address  Phone  Notes  [facility] Behavioral Health  [phone]   [facility] Services  [phone]   [facility] [address] [place] [phone] or [phone]    Mobile Crisis Teams Organization         Address  Phone  Notes  [facility], Mobile Crisis Care Unit  [phone]   [facility]  [address] [place], [place] [phone]   Doristine Locks [address] [place] [phone]    Self-Help/Support Groups Organization         Address  Phone             Notes  [facility] [place] - variety of support groups  [phone]- I7437963 Call for more information  Narcotics Anonymous (NA), [facility] [address], [employer] [place]  2 meetings at this location   Statistician         Address  Phone  Notes  [facility] [address],    [place]  [phone]   [facility]  [address] [place] [address], [place], [place] [phone]  [facility] [address] [place], [place] [phone] Admissions: 8am-3pm M-F  [facility] [address],    [place], [place] [phone]   [facility] [address] [place], [place], [place] [phone]   [facility] [address]  [place],  [place] [phone]   [facility] - Intensive Outpatient [address]ure[address], [place], [place] [phone]   [facility] ([facility] [address]  [place], [place] [phone] or [phone]   [facility] (RT[facility]) [address] [place], [place] [phone] Accepts Medicaid  [facility] [address],  [place] [phone] Substance Abuse/Addiction Treatment   [facility] Organization         Address  Phone  Notes  [facility]  [phone]   Angie Fava, PhD [address] Ervin Knack [place], [place]   [phone] or [phone]   [facility]   [address] [place], [place] [phone]   [facility] [address] [place], [place] [phone] Insurance/Medicaid/sponsorship through [facility] and Families [address]                                    [place], [place] [phone] Therapy/tele-psych/case  [facility] [address] [place], [place] [phone]    Dr. Lolly Mustache  [phone]   [facility]  United Way [facility] Dept. 1) [address] [place] 2) [address] [place] 3)  [address], [place] [phone]  [phone]   [facility] Child Abuse Hotline 303 629 5151 or [phone] (After Hours)

## 2014-12-13 NOTE — ED Notes (Signed)
AVS explained in detail. Knows to utilize albuterol inhaler and to take entire antibiotic regimen. Knows to increase fluid intake and to see PCP in 3 days. Resource guide given. RR even/unlabored. No other c/c.

## 2014-12-13 NOTE — ED Provider Notes (Signed)
CSN: [id number]     Arrival date & time 12/13/14  1812 History  By signing my name below, I, Melissa Chan, attest that this documentation has been prepared under the direction and in the presence of [employer], PA-C. Electronically Signed: Phillis Chan, ED Scribe. 12/13/2014. 9:27 PM.  Chief Complaint  Patient presents with  . Cough  . Nasal Congestion  . Abscess   The history is provided by the patient. No language interpreter was used.  HPI Comments: Melissa Chan is a 24 y.o. female who presents to the Emergency Department complaining of gradually worsening, constant productive cough onset 5 days ago. Pt reports associated nasal congestion, watery drainage, and bilateral otalgia when blowing her nose. She states that her sister has been sick with a virus. Pt believes she has staph because she has an area of multiple rashes to her right axilla. She states that there has been drainage since Wednesday. She denies fever, chills, nausea, or vomiting. She denies a hx of asthma or smoking.    Past Medical History  Diagnosis Date  . Medical history non-contributory    Past Surgical History  Procedure Laterality Date  . No past surgeries     No family history on file. Social History  Substance Use Topics  . Smoking status: Never Smoker   . Smokeless tobacco: None  . Alcohol Use: No   OB History    Gravida Para Term Preterm AB TAB SAB Ectopic Multiple Living   0 1     Review of Systems  Constitutional: Negative for fever and chills.  HENT: Positive for congestion.   Eyes: Positive for discharge.  Respiratory: Positive for cough.   Cardiovascular: Positive for chest pain.  Gastrointestinal: Negative for nausea and vomiting.  Skin: Positive for wound.  All other systems reviewed and are negative.     Allergies  Review of patient's allergies indicates no known allergies.  Home Medications   Prior to Admission medications   Medication Sig Start  Date End Date Taking? Authorizing Provider  benzonatate (TESSALON) 100 MG capsule Take 1 capsule (100 mg total) by mouth every 8 (eight) hours. 12/13/14   Marialena Wollen, PA-C  cephALEXin (KEFLEX) 500 MG capsule Take 1 capsule (500 mg total) by mouth 4 (four) times daily. 12/13/14   Cesilia Shinn, PA-C  sulfamethoxazole-trimethoprim (BACTRIM DS,SEPTRA DS) 800-160 MG tablet Take 1 tablet by mouth 2 (two) times daily. 12/13/14 12/20/14  Zeola Brys, PA-C   BP 136/84 mmHg  Pulse 100  Temp(Src) 98.6 F (37 C) (Oral)  Resp 18  SpO2 100%  LMP 11/28/2014 Physical Exam  Constitutional: She is oriented to person, place, and time. She appears well-developed and well-nourished. No distress.  HENT:  Head: Normocephalic and atraumatic.  Right Ear: Tympanic membrane, external ear and ear canal normal.  Left Ear: Tympanic membrane, external ear and ear canal normal.  Nose: Mucosal edema and rhinorrhea present. No epistaxis. Right sinus exhibits no maxillary sinus tenderness and no frontal sinus tenderness. Left sinus exhibits no maxillary sinus tenderness and no frontal sinus tenderness.  Mouth/Throat: Uvula is midline and mucous membranes are normal. Mucous membranes are not pale and not cyanotic. No oropharyngeal exudate, posterior oropharyngeal edema, posterior oropharyngeal erythema or tonsillar abscesses.  Eyes: Conjunctivae are normal. Pupils are equal, round, and reactive to light.  Neck: Normal range of motion and full passive range of motion without pain.  Cardiovascular: Normal rate and intact distal pulses.  Pulmonary/Chest: Effort normal and breath sounds normal. No stridor.  Clear and equal breath sounds without focal wheezes, rhonchi, rales  Abdominal: Soft. Bowel sounds are normal. There is no tenderness.  Musculoskeletal: Normal range of motion.  Lymphadenopathy:    She has no cervical adenopathy.  Neurological: She is alert and oriented to person, place, and time.   Skin: Skin is warm and dry. No rash noted. She is not diaphoretic.  Right axilla: numerous small areas of redness and induration, several of them draining. Located in the axilla and just below.   Psychiatric: She has a normal mood and affect.  Nursing note and vitals reviewed.   ED Course  Procedures (including critical care time) DIAGNOSTIC STUDIES: Oxygen Saturation is 99% on RA, normal by my interpretation.    COORDINATION OF CARE: 8:47 PM-Discussed treatment plan which includes breathing treatment, cough medication and anti-biotics with pt at bedside and pt agreed to plan.   Labs Review Labs Reviewed - No data to display  Imaging Review Dg Chest 2 View  12/13/2014  CLINICAL DATA:  Patient with cough and wheezing for 1 week. EXAM: CHEST  2 VIEW COMPARISON:  None. FINDINGS: The heart size and mediastinal contours are within normal limits. Both lungs are clear. The visualized skeletal structures are unremarkable. IMPRESSION: No active cardiopulmonary disease. Electronically Signed   By: Annia Beltrew  Davis M.D.   On: 12/13/2014 21:21   I have personally reviewed and evaluated these images and lab results as part of my medical decision-making.   EKG Interpretation None      MDM   Final diagnoses:  Bronchospasm with bronchitis, acute  Cutaneous abscess of other site  Viral URI with cough   [name] presents with URI symptoms.  Pt CXR negative for acute infiltrate. Patient was wheezing on exam. Given albuterol nebulizer with significant improvement. Patients symptoms are consistent with URI, likely viral etiology. Pt will be discharged with symptomatic treatment.   Patient's chest pain is rib pain and it is reproducible. I doubt ACS.   Patient also with multiple small abscesses in the right axilla; several are draining.  No large abscess or collection of fluctuance to warrant I&D. Patient expresses concern for MRSA and I would agree this is a concern.  Patient reports no  history of MRSA, hidradenitis suppurativa or recurrent abscesses.  no evidence of systemic infection. Will give Bactrim and Keflex. Patient is to follow-up with her primary care provider within 3 days for wound check.  Verbalizes understanding and is agreeable with plan. Pt is hemodynamically stable & in NAD prior to dc.  BP 136/84 mmHg  Pulse 100  Temp(Src) 98.6 F (37 C) (Oral)  Resp 18  SpO2 100%  LMP 11/28/2014  I personally performed the services described in this documentation, which was scribed in my presence. The recorded information has been reviewed and is accurate.   Dahlia ClientHannah Treniece Holsclaw, PA-C 12/14/14 0600  Doug SouSam Jacubowitz, MD 12/15/14 [phone]

## 2014-12-13 NOTE — ED Notes (Addendum)
Pt complains of persistent cough since Monday, nasal congestion and watery eyes since last night. Pt also has an abscess to the right of her right breast. Pt states her chest hurts when coughing.

## 2015-10-24 LAB — PROCEDURE REPORT - SCANNED: Pap: NEGATIVE

## 2015-10-30 ENCOUNTER — Ambulatory Visit (INDEPENDENT_AMBULATORY_CARE_PROVIDER_SITE_OTHER): Payer: Medicaid Other | Admitting: Obstetrics & Gynecology

## 2015-10-30 ENCOUNTER — Other Ambulatory Visit (HOSPITAL_COMMUNITY)
Admission: RE | Admit: 2015-10-30 | Discharge: 2015-10-30 | Disposition: A | Discharge status: Home / Self Care | Payer: Medicaid Other | Source: Ambulatory Visit | Attending: Obstetrics & Gynecology | Admitting: Obstetrics & Gynecology

## 2015-10-30 ENCOUNTER — Encounter: Payer: Self-pay | Admitting: Obstetrics & Gynecology

## 2015-10-30 VITALS — BP 133/87 | HR 106 | Temp 98.6°F | Wt 284.7 lb

## 2015-10-30 DIAGNOSIS — Z3490 Encounter for supervision of normal pregnancy, unspecified, unspecified trimester: Secondary | ICD-10-CM | POA: Insufficient documentation

## 2015-10-30 DIAGNOSIS — Z3482 Encounter for supervision of other normal pregnancy, second trimester: Secondary | ICD-10-CM

## 2015-10-30 DIAGNOSIS — Z113 Encounter for screening for infections with a predominantly sexual mode of transmission: Secondary | ICD-10-CM | POA: Diagnosis not present

## 2015-10-30 DIAGNOSIS — Z3480 Encounter for supervision of other normal pregnancy, unspecified trimester: Secondary | ICD-10-CM

## 2015-10-30 NOTE — Progress Notes (Signed)
  Subjective:    Melissa Chan is a [id number] Unknown being seen today for her first obstetrical visit.  Her obstetrical history is significant for h/o preterm birth. Patient does intend to breast feed. Pregnancy history fully reviewed.  Patient reports no complaints.  Vitals:   10/30/15 1354  BP: 133/87  Pulse: (!) 106  Temp: 98.6 F (37 C)  Weight: 284 lb 11.2 oz (129.1 kg)    HISTORY: OB History  Gravida Para Term Preterm AB Living  5 4 3 1   4   SAB TAB Ectopic Multiple Live Births        0 4    # Outcome Date GA Lbr Len/2nd Weight Sex Delivery Anes PTL Lv  5 Current           4 Term 01/14/14 9538w0d 10:47 / 00:05 7 lb 8.8 oz (3.425 kg) F Vag-Spont None  LIV     Birth Comments: WNL  3 Term 03/17/12 5933w0d  6 lb 13 oz (3.09 kg) M    LIV  2 Term 04/04/06 3638w0d  8 lb 14 oz (4.026 kg) M Vag-Spont EPI  LIV  1 Preterm 02/27/05 6842w0d  3 lb 14 oz (1.758 kg) F    LIV     Past Medical History:  Diagnosis Date  . Medical history non-contributory    Past Surgical History:  Procedure Laterality Date  . NO PAST SURGERIES     Family History  Problem Relation Age of Onset  . Hypertension Mother   . Hypertension Father   . Diabetes Father   . Cancer Maternal Grandmother   . Heart disease Paternal Grandmother      Exam    Uterus:     Pelvic Exam:    Perineum: No Hemorrhoids   Vulva: normal   Vagina:  normal mucosa   pH:  d/c c/w yeast   Cervix: no lesions   Adnexa: not evaluated   Bony Pelvis: average  System: Breast:  normal appearance, no masses or tenderness   Skin: normal coloration and turgor, no rashes    Neurologic: oriented, normal mood   Extremities: normal strength, tone, and muscle mass   HEENT thyroid without masses   Mouth/Teeth mucous membranes moist, pharynx normal without lesions and dental hygiene good   Neck supple   Cardiovascular: regular rate and rhythm   Respiratory:  appears well, vitals normal, no respiratory distress, acyanotic, normal  RR   Abdomen: soft, non-tender; bowel sounds normal; no masses,  no organomegaly   Urinary: urethral meatus normal      Assessment:    Pregnancy: [id number] Patient Active Problem List   Diagnosis Date Noted  . Supervision of normal pregnancy, antepartum 10/30/2015        Plan:     Initial labs drawn. Prenatal vitamins. Problem list reviewed and updated. Genetic Screening discussed Quad Screen: ordered.  Ultrasound discussed; fetal survey: ordered.  Follow up in 4 weeks. 50% of 30 min visit spent on counseling and coordination of care.  Declines 17 P   Melissa Chan 10/30/2015

## 2015-10-31 LAB — GC/CHLAMYDIA PROBE AMP (~~LOC~~) NOT AT ARMC
Chlamydia: NEGATIVE
Neisseria Gonorrhea: NEGATIVE

## 2015-11-01 LAB — URINE CULTURE, OB REFLEX

## 2015-11-01 LAB — CULTURE, OB URINE

## 2015-11-06 LAB — HEMOGLOBINOPATHY EVALUATION
HEMOGLOBIN F QUANTITATION: 0 % (ref 0.0–2.0)
HGB C: 0 %
HGB S: 0 %
Hemoglobin A2 Quantitation: 2.3 % (ref 0.7–3.1)
Hgb A: 97.7 % (ref 94.0–98.0)

## 2015-11-06 LAB — AFP, QUAD SCREEN
DIA Mom Value: 1.47
DIA Value (EIA): 222.85 pg/mL
DSR (By Age)    1 IN: 871
DSR (SECOND TRIMESTER) 1 IN: 1158
Gestational Age: 21 WEEKS
MATERNAL AGE AT EDD: 27.8 a
MSAFP Mom: 1.09
MSAFP: 52.9 ng/mL
MSHCG Mom: 2.05
MSHCG: 31930 m[IU]/mL
Osb Risk: 10000
Test Results:: NEGATIVE
UE3 MOM: 0.77
WEIGHT: 284 [lb_av]
uE3 Value: 1.41 ng/mL

## 2015-11-06 LAB — PRENATAL PROFILE I(LABCORP)
Antibody Screen: NEGATIVE
BASOS ABS: 0 10*3/uL (ref 0.0–0.2)
Basos: 0 %
EOS (ABSOLUTE): 0.2 10*3/uL (ref 0.0–0.4)
Eos: 3 %
HEMOGLOBIN: 11.3 g/dL (ref 11.1–15.9)
Hematocrit: 33.9 % — ABNORMAL LOW (ref 34.0–46.6)
Hepatitis B Surface Ag: NEGATIVE
Immature Grans (Abs): 0 10*3/uL (ref 0.0–0.1)
Immature Granulocytes: 0 %
LYMPHS ABS: 2 10*3/uL (ref 0.7–3.1)
Lymphs: 28 %
MCH: 25.9 pg — AB (ref 26.6–33.0)
MCHC: 33.3 g/dL (ref 31.5–35.7)
MCV: 78 fL — AB (ref 79–97)
MONOS ABS: 0.8 10*3/uL (ref 0.1–0.9)
Monocytes: 11 %
Neutrophils Absolute: 4.3 10*3/uL (ref 1.4–7.0)
Neutrophils: 58 %
PLATELETS: 325 10*3/uL (ref 150–379)
RBC: 4.36 x10E6/uL (ref 3.77–5.28)
RDW: 13.9 % (ref 12.3–15.4)
RPR: NONREACTIVE
RUBELLA: 2.26 {index} (ref >0.99)
Rh Factor: POSITIVE
WBC: 7.3 10*3/uL (ref 3.4–10.8)

## 2015-11-06 LAB — HIV ANTIBODY (ROUTINE TESTING W REFLEX): HIV Screen 4th Generation wRfx: NONREACTIVE

## 2015-11-06 LAB — TOXASSURE SELECT 13 (MW), URINE

## 2015-11-07 ENCOUNTER — Other Ambulatory Visit: Payer: Self-pay | Admitting: Obstetrics & Gynecology

## 2015-11-07 ENCOUNTER — Ambulatory Visit (HOSPITAL_COMMUNITY)
Admission: RE | Admit: 2015-11-07 | Discharge: 2015-11-07 | Disposition: A | Discharge status: Home / Self Care | Payer: Medicaid Other | Source: Ambulatory Visit | Attending: Obstetrics & Gynecology | Admitting: Obstetrics & Gynecology

## 2015-11-07 ENCOUNTER — Encounter (HOSPITAL_COMMUNITY): Payer: Self-pay

## 2015-11-07 DIAGNOSIS — O09212 Supervision of pregnancy with history of pre-term labor, second trimester: Secondary | ICD-10-CM | POA: Diagnosis not present

## 2015-11-07 DIAGNOSIS — Z363 Encounter for antenatal screening for malformations: Secondary | ICD-10-CM | POA: Diagnosis not present

## 2015-11-07 DIAGNOSIS — Z1389 Encounter for screening for other disorder: Secondary | ICD-10-CM

## 2015-11-07 DIAGNOSIS — O0932 Supervision of pregnancy with insufficient antenatal care, second trimester: Secondary | ICD-10-CM | POA: Insufficient documentation

## 2015-11-07 DIAGNOSIS — O99212 Obesity complicating pregnancy, second trimester: Secondary | ICD-10-CM

## 2015-11-07 DIAGNOSIS — Z3A22 22 weeks gestation of pregnancy: Secondary | ICD-10-CM | POA: Diagnosis not present

## 2015-11-07 DIAGNOSIS — Z3480 Encounter for supervision of other normal pregnancy, unspecified trimester: Secondary | ICD-10-CM

## 2015-11-07 MED ORDER — TERCONAZOLE 0.8 % VA CREA: 1.0000 | TOPICAL_CREAM | Freq: Every day | VAGINAL | 0 refills | Status: DC

## 2015-11-07 NOTE — Progress Notes (Signed)
Patient called stating she was told and her appointment during exam that "she had a yeast infection".  Rx was never prescribed.  Rx routed to pharmacy per protocol.  Patient notified.

## 2015-11-27 ENCOUNTER — Ambulatory Visit (INDEPENDENT_AMBULATORY_CARE_PROVIDER_SITE_OTHER): Payer: Medicaid Other | Admitting: Obstetrics & Gynecology

## 2015-11-27 VITALS — BP 103/66 | HR 101 | Temp 97.1°F | Wt 280.0 lb

## 2015-11-27 DIAGNOSIS — Z3480 Encounter for supervision of other normal pregnancy, unspecified trimester: Secondary | ICD-10-CM

## 2015-11-27 DIAGNOSIS — O99212 Obesity complicating pregnancy, second trimester: Secondary | ICD-10-CM

## 2015-11-27 DIAGNOSIS — Z3482 Encounter for supervision of other normal pregnancy, second trimester: Secondary | ICD-10-CM

## 2015-11-27 DIAGNOSIS — Z23 Encounter for immunization: Secondary | ICD-10-CM

## 2015-11-27 DIAGNOSIS — E669 Obesity, unspecified: Secondary | ICD-10-CM

## 2015-11-27 DIAGNOSIS — O99210 Obesity complicating pregnancy, unspecified trimester: Secondary | ICD-10-CM | POA: Insufficient documentation

## 2015-11-27 NOTE — Progress Notes (Signed)
Patient is in the office report fatigue, good fetal movement.

## 2015-11-27 NOTE — Patient Instructions (Signed)

## 2015-11-27 NOTE — Addendum Note (Signed)
Addended by: Natale MilchSTALLING, Demarion Pondexter D on: 11/27/2015 02:42 PM   Modules accepted: Orders

## 2015-11-27 NOTE — Progress Notes (Signed)
   PRENATAL VISIT NOTE  Subjective:  Melissa Chan is a 25 y.o. [id number] at 2936w0d being seen today for ongoing prenatal care.  She is currently monitored for the following issues for this high-risk pregnancy and has Supervision of normal pregnancy, antepartum on her problem list.  Patient reports fatigue.  Contractions: Not present. Vag. Bleeding: None.  Movement: Present. Denies leaking of fluid.   The following portions of the patient's history were reviewed and updated as appropriate: allergies, current medications, past family history, past medical history, past social history, past surgical history and problem list. Problem list updated.  Objective:   Vitals:   11/27/15 1401  BP: 103/66  Pulse: (!) 101  Temp: 97.1 F (36.2 C)  Weight: 280 lb (127 kg)    Fetal Status:     Movement: Present     General:  Alert, oriented and cooperative. Patient is in no acute distress.  Skin: Skin is warm and dry. No rash noted.   Cardiovascular: Normal heart rate noted  Respiratory: Normal respiratory effort, no problems with respiration noted  Abdomen: Soft, gravid, appropriate for gestational age. Pain/Pressure: Present     Pelvic:  Cervical exam deferred        Extremities: Normal range of motion.  Edema: None  Mental Status: Normal mood and affect. Normal behavior. Normal judgment and thought content.   Assessment and Plan:  Pregnancy: J4N8295G5P3104 at 936w0d  1. Supervision of other normal pregnancy, antepartum Flu vaccine and tdap given today 1 hour GTT, RPR and HIV next visit   2. Obesity in pregnancy  Preterm labor symptoms and general obstetric precautions including but not limited to vaginal bleeding, contractions, leaking of fluid and fetal movement were reviewed in detail with the patient. Please refer to After Visit Summary for other counseling recommendations.  No Follow-up on file.  Willodean Rosenthalarolyn Harraway-Smith, MD

## 2015-12-25 ENCOUNTER — Encounter: Payer: Medicaid Other | Admitting: Obstetrics & Gynecology

## 2015-12-26 ENCOUNTER — Encounter: Payer: Self-pay | Admitting: Obstetrics

## 2015-12-26 ENCOUNTER — Ambulatory Visit (INDEPENDENT_AMBULATORY_CARE_PROVIDER_SITE_OTHER): Payer: Medicaid Other | Admitting: Obstetrics

## 2015-12-26 VITALS — BP 108/70 | HR 96 | Temp 98.4°F | Wt 279.6 lb

## 2015-12-26 DIAGNOSIS — Z3480 Encounter for supervision of other normal pregnancy, unspecified trimester: Secondary | ICD-10-CM

## 2015-12-26 DIAGNOSIS — Z3483 Encounter for supervision of other normal pregnancy, third trimester: Secondary | ICD-10-CM

## 2015-12-26 DIAGNOSIS — B373 Candidiasis of vulva and vagina: Secondary | ICD-10-CM

## 2015-12-26 DIAGNOSIS — O98813 Other maternal infectious and parasitic diseases complicating pregnancy, third trimester: Secondary | ICD-10-CM

## 2015-12-26 DIAGNOSIS — B3731 Acute candidiasis of vulva and vagina: Secondary | ICD-10-CM

## 2015-12-26 MED ORDER — OB COMPLETE PETITE 35-5-1-200 MG PO CAPS: 1.0000 | ORAL_CAPSULE | Freq: Every day | ORAL | 3 refills | Status: DC

## 2015-12-26 MED ORDER — FLUCONAZOLE 150 MG PO TABS: 150.0000 mg | ORAL_TABLET | Freq: Once | ORAL | 0 refills | Status: AC

## 2015-12-26 NOTE — Progress Notes (Signed)
Subjective:    Melissa Chan is a 25 y.o. female being seen today for her obstetrical visit. She is at 6865w1d gestation. Patient reports vaginal irritation. Fetal movement: normal.  Problem List Items Addressed This Visit    Supervision of normal pregnancy, antepartum - Primary    Other Visit Diagnoses    Candida vaginitis       Relevant Medications   fluconazole (DIFLUCAN) 150 MG tablet     Patient Active Problem List   Diagnosis Date Noted  . Obesity affecting pregnancy, antepartum 11/27/2015  . Supervision of normal pregnancy, antepartum 10/30/2015   Objective:    BP 108/70   Pulse 96   Temp 98.4 F (36.9 C)   Wt 279 lb 9.6 oz (126.8 kg)   LMP 06/05/2015 (Exact Date)   BMI 41.29 kg/m  FHT:  150 BPM  Uterine Size: size equals dates  Presentation: unsure     Assessment:    Pregnancy @ 3865w1d weeks   Desires PPTL.  Plan:   Tubal Papers Signed on today's visit   labs reviewed, problem list updated Consent signed. GBS sent TDAP offered  Rhogam given for RH negative Pediatrician: discussed. Infant feeding: plans to breastfeed. Maternity leave: discussed. Cigarette smoking: never smoked. No orders of the defined types were placed in this encounter.  Meds ordered this encounter  Medications  . fluconazole (DIFLUCAN) 150 MG tablet    Sig: Take 1 tablet (150 mg total) by mouth once.    Dispense:  1 tablet    Refill:  0   Follow up in 1 Week.

## 2015-12-31 ENCOUNTER — Other Ambulatory Visit: Payer: Medicaid Other

## 2015-12-31 ENCOUNTER — Other Ambulatory Visit: Payer: Self-pay | Admitting: Obstetrics

## 2015-12-31 DIAGNOSIS — B9689 Other specified bacterial agents as the cause of diseases classified elsewhere: Secondary | ICD-10-CM

## 2015-12-31 DIAGNOSIS — N760 Acute vaginitis: Principal | ICD-10-CM

## 2015-12-31 LAB — NUSWAB BV AND CANDIDA, NAA
Candida albicans, NAA: POSITIVE — AB
Candida glabrata, NAA: NEGATIVE

## 2015-12-31 MED ORDER — METRONIDAZOLE 500 MG PO TABS: 500.0000 mg | ORAL_TABLET | Freq: Two times a day (BID) | ORAL | 2 refills | Status: DC

## 2016-01-01 ENCOUNTER — Telehealth: Payer: Self-pay

## 2016-01-01 NOTE — Telephone Encounter (Signed)
Spoke with patient and informed of lab results and rx sent.

## 2016-01-07 ENCOUNTER — Other Ambulatory Visit: Payer: Medicaid Other

## 2016-01-07 DIAGNOSIS — Z3493 Encounter for supervision of normal pregnancy, unspecified, third trimester: Secondary | ICD-10-CM

## 2016-01-08 ENCOUNTER — Other Ambulatory Visit: Payer: Self-pay | Admitting: Obstetrics

## 2016-01-08 DIAGNOSIS — O24410 Gestational diabetes mellitus in pregnancy, diet controlled: Secondary | ICD-10-CM

## 2016-01-08 LAB — CBC
Hematocrit: 31.9 % — ABNORMAL LOW (ref 34.0–46.6)
Hemoglobin: 10.3 g/dL — ABNORMAL LOW (ref 11.1–15.9)
MCH: 26 pg — AB (ref 26.6–33.0)
MCHC: 32.3 g/dL (ref 31.5–35.7)
MCV: 81 fL (ref 79–97)
PLATELETS: 273 10*3/uL (ref 150–379)
RBC: 3.96 x10E6/uL (ref 3.77–5.28)
RDW: 14.4 % (ref 12.3–15.4)
WBC: 6.5 10*3/uL (ref 3.4–10.8)

## 2016-01-08 LAB — GLUCOSE TOLERANCE, 2 HOURS W/ 1HR
GLUCOSE, 1 HOUR: 218 mg/dL — AB (ref 65–179)
GLUCOSE, FASTING: 115 mg/dL — AB (ref 65–91)
Glucose, 2 hour: 186 mg/dL — ABNORMAL HIGH (ref 65–152)

## 2016-01-08 LAB — RPR: RPR Ser Ql: NONREACTIVE

## 2016-01-08 LAB — HIV ANTIBODY (ROUTINE TESTING W REFLEX): HIV SCREEN 4TH GENERATION: NONREACTIVE

## 2016-01-21 ENCOUNTER — Encounter: Payer: Medicaid Other | Attending: Obstetrics | Admitting: Skilled Nursing Facility1

## 2016-01-21 DIAGNOSIS — Z3A00 Weeks of gestation of pregnancy not specified: Secondary | ICD-10-CM | POA: Insufficient documentation

## 2016-01-21 DIAGNOSIS — O24419 Gestational diabetes mellitus in pregnancy, unspecified control: Secondary | ICD-10-CM | POA: Insufficient documentation

## 2016-01-21 DIAGNOSIS — Z713 Dietary counseling and surveillance: Secondary | ICD-10-CM | POA: Diagnosis present

## 2016-01-21 DIAGNOSIS — O24410 Gestational diabetes mellitus in pregnancy, diet controlled: Secondary | ICD-10-CM

## 2016-01-21 NOTE — Progress Notes (Signed)
  Patient was seen on 01/21/16 for Gestational Diabetes self-management class at the [facility]. Patient reported a 10 lb weight loss since October. Patient had a The following learning objectives were met by the patient during this course:   States the definition of Gestational Diabetes  States why dietary management is important in controlling blood glucose  Describes the effects each nutrient has on blood glucose levels  Demonstrates ability to create a balanced meal plan  Demonstrates carbohydrate counting   States when to check blood glucose levels involving a total of 4 separate occurences in a day  Demonstrates proper blood glucose monitoring techniques  States the effect of stress and exercise on blood glucose levels  States the importance of limiting caffeine and abstaining from alcohol and smoking  Demonstrates the knowledge the glucometer provided in class may not be covered by their insurance and to call their insurance provider immediately after class to know which glucometer their insurance provider does cover as well as calling their physician the next day for a prescription to the glucometer their insurance does cover (if the one provided is not) as well as the lancets and strips for that meter.  Blood glucose monitor given: Accu Chek Guide Lot # [id number] Exp: 2/19 Blood glucose reading: 140  Patient instructed to monitor glucose levels: FBS: 60 - <90 1 hour: <140 2 hour: <120  *Patient received handouts:  Nutrition Diabetes and Pregnancy  Carbohydrate Counting List  Patient will be seen for follow-up as needed.

## 2016-01-22 ENCOUNTER — Telehealth: Payer: Self-pay

## 2016-01-22 NOTE — Telephone Encounter (Signed)
Returned call and spoke to patient about diabetic supplies, contacted pharmacy to order per provider approval.

## 2016-01-26 NOTE — L&D Delivery Note (Signed)
26 y.o. B2W4132G5P3104 at 2460w0d delivered a viable female infant in cephalic, LOA >ROA position. Loose nuchal cord x1, easily reduced at perineum. Left anterior shoulder delivered with ease. 60 sec delayed cord clamping. Cord clamped x2 and cut. Placenta delivered spontaneously intact, with 3VC. Fundus firm on exam with massage and pitocin. Good hemostasis noted.  Anesthesia: Epidural Laceration: None Suture: N/A Good hemostasis noted. EBL: 100cc  Mom and baby recovering in LDR.    Apgars: 9/10 Weight: Pending skin to skin    Melissa MowElizabeth Shamar Kracke, Melissa Chan OB Fellow [facility] 03/11/2016, 4:40 PM

## 2016-01-28 ENCOUNTER — Ambulatory Visit (INDEPENDENT_AMBULATORY_CARE_PROVIDER_SITE_OTHER): Payer: Medicaid Other | Admitting: Obstetrics

## 2016-01-28 ENCOUNTER — Encounter: Payer: Self-pay | Admitting: Obstetrics

## 2016-01-28 VITALS — BP 97/65 | HR 108 | Wt 270.0 lb

## 2016-01-28 DIAGNOSIS — Z3493 Encounter for supervision of normal pregnancy, unspecified, third trimester: Secondary | ICD-10-CM

## 2016-01-28 DIAGNOSIS — Z3480 Encounter for supervision of other normal pregnancy, unspecified trimester: Secondary | ICD-10-CM

## 2016-01-28 DIAGNOSIS — Z3490 Encounter for supervision of normal pregnancy, unspecified, unspecified trimester: Secondary | ICD-10-CM

## 2016-01-28 DIAGNOSIS — O24419 Gestational diabetes mellitus in pregnancy, unspecified control: Secondary | ICD-10-CM | POA: Insufficient documentation

## 2016-01-28 LAB — POCT FERNING: FERNING, POC: NEGATIVE

## 2016-01-28 NOTE — Progress Notes (Signed)
Subjective:    Melissa Chan is a 26 y.o. female being seen today for her obstetrical visit. She is at 6172w6d gestation. Patient reports occasional leaking clear fluid. Fetal movement: normal.  Problem List Items Addressed This Visit    Gestational diabetes mellitus   Supervision of normal pregnancy, antepartum - Primary   Relevant Orders   POCT Ferning (Completed)   NuSwab Vaginitis Plus (VG+)     Patient Active Problem List   Diagnosis Date Noted  . Gestational diabetes mellitus 01/28/2016  . Obesity affecting pregnancy, antepartum 11/27/2015  . Supervision of normal pregnancy, antepartum 10/30/2015   Objective:    BP 97/65   Pulse (!) 108   Wt 270 lb (122.5 kg)   LMP 06/05/2015 (Exact Date)   BMI 39.87 kg/m  FHT:  150 BPM  Uterine Size: size equals dates  Presentation: unsure     Assessment:    Pregnancy @ 4472w6d weeks   Plan:     labs reviewed, problem list updated Consent signed. GBS sent TDAP offered  Rhogam given for RH negative Pediatrician: discussed. Infant feeding: plans to breastfeed. Maternity leave: discussed. Cigarette smoking: never smoked. Orders Placed This Encounter  Procedures  . NuSwab Vaginitis Plus (VG+)  . POCT Ferning   No orders of the defined types were placed in this encounter.  Follow up in 2 Weeks.   Patient ID: Melissa Chan, female   DOB: Feb 25, 1990, 26 y.o.   MRN: [id number]

## 2016-01-28 NOTE — Progress Notes (Signed)
Pt c/o possible leaking fluid since yesterday. Denies pain/pressure. Pt does not have CBG log today but states sugars have been WNL.

## 2016-02-02 ENCOUNTER — Other Ambulatory Visit: Payer: Self-pay

## 2016-02-02 ENCOUNTER — Other Ambulatory Visit: Payer: Self-pay | Admitting: Obstetrics

## 2016-02-02 LAB — NUSWAB VAGINITIS PLUS (VG+)
CANDIDA ALBICANS, NAA: POSITIVE — AB
CANDIDA GLABRATA, NAA: NEGATIVE
CHLAMYDIA TRACHOMATIS, NAA: NEGATIVE
NEISSERIA GONORRHOEAE, NAA: NEGATIVE
TRICH VAG BY NAA: NEGATIVE

## 2016-02-02 MED ORDER — FLUCONAZOLE 150 MG PO TABS: 150.0000 mg | ORAL_TABLET | Freq: Once | ORAL | 0 refills | Status: AC

## 2016-02-02 NOTE — Telephone Encounter (Signed)
TC from pt said she was told something for Yeast was going to be called to the pharmacy. Recent labs shows yeast. Pt wants medication in a pill form. Diflucan sent to her pharmacy.

## 2016-02-11 ENCOUNTER — Encounter: Payer: Medicaid Other | Admitting: Obstetrics

## 2016-02-18 ENCOUNTER — Ambulatory Visit (INDEPENDENT_AMBULATORY_CARE_PROVIDER_SITE_OTHER): Payer: Medicaid Other | Admitting: Obstetrics

## 2016-02-18 VITALS — BP 106/70 | HR 109 | Wt 272.4 lb

## 2016-02-18 DIAGNOSIS — Z3493 Encounter for supervision of normal pregnancy, unspecified, third trimester: Secondary | ICD-10-CM

## 2016-02-18 DIAGNOSIS — Z3490 Encounter for supervision of normal pregnancy, unspecified, unspecified trimester: Secondary | ICD-10-CM

## 2016-02-18 DIAGNOSIS — Z3480 Encounter for supervision of other normal pregnancy, unspecified trimester: Secondary | ICD-10-CM

## 2016-02-18 DIAGNOSIS — O3663X0 Maternal care for excessive fetal growth, third trimester, not applicable or unspecified: Secondary | ICD-10-CM

## 2016-02-18 NOTE — Progress Notes (Signed)
GBS due today. GDM pt did not bring CBG log

## 2016-02-19 ENCOUNTER — Encounter: Payer: Self-pay | Admitting: Obstetrics

## 2016-02-19 LAB — OB RESULTS CONSOLE GBS: STREP GROUP B AG: NEGATIVE

## 2016-02-19 NOTE — Progress Notes (Signed)
Subjective:    Melissa Chan is a 26 y.o. female being seen today for her obstetrical visit. She is at 6661w0d gestation. Patient reports no complaints. Fetal movement: normal.  Problem List Items Addressed This Visit    Supervision of normal pregnancy, antepartum - Primary   Relevant Orders   Strep Gp B NAA    Other Visit Diagnoses    Excessive fetal growth affecting management of pregnancy in third trimester, single or unspecified fetus       Relevant Orders   US MFM OB FOLLOW UP     Patient Active Problem List   Diagnosis Date Noted  . Gestational diabetes mellitus 01/28/2016  . Obesity affecting pregnancy, antepartum 11/27/2015  . Supervision of normal pregnancy, antepartum 10/30/2015    Objective:    BP 106/70   Pulse (!) 109   Wt 272 lb 6.4 oz (123.6 kg)   LMP 06/05/2015 (Exact Date)   BMI 40.23 kg/m  FHT: 150 BPM  Uterine Size: size equals dates  Presentations: cephalic    Assessment:    Pregnancy @ 5461w0d weeks   Plan:   Plans for delivery: Vaginal anticipated; labs reviewed; problem list updated Counseling: Consent signed. Infant feeding: plans to breastfeed. Cigarette smoking: never smoked. L&D discussion: symptoms of labor, discussed when to call, discussed what number to call, anesthetic/analgesic options reviewed and delivering clinician:  plans no preference. Postpartum supports and preparation: circumcision discussed and contraception plans discussed.  Follow up in 1 Week.  Patient ID: Melissa LowerShaquanna L Ratterman, female   DOB: Sep 27, 1990, 26 y.o.   MRN: [id number]

## 2016-02-20 LAB — STREP GP B NAA: STREP GROUP B AG: NEGATIVE

## 2016-02-24 ENCOUNTER — Other Ambulatory Visit: Payer: Self-pay | Admitting: Obstetrics

## 2016-02-24 ENCOUNTER — Ambulatory Visit (HOSPITAL_COMMUNITY)
Admission: RE | Admit: 2016-02-24 | Discharge: 2016-02-24 | Disposition: A | Discharge status: Home / Self Care | Payer: Medicaid Other | Source: Ambulatory Visit | Attending: Obstetrics | Admitting: Obstetrics

## 2016-02-24 ENCOUNTER — Encounter (HOSPITAL_COMMUNITY): Payer: Self-pay

## 2016-02-24 ENCOUNTER — Ambulatory Visit (INDEPENDENT_AMBULATORY_CARE_PROVIDER_SITE_OTHER): Payer: Medicaid Other | Admitting: Obstetrics & Gynecology

## 2016-02-24 DIAGNOSIS — O3663X0 Maternal care for excessive fetal growth, third trimester, not applicable or unspecified: Secondary | ICD-10-CM

## 2016-02-24 DIAGNOSIS — O24410 Gestational diabetes mellitus in pregnancy, diet controlled: Secondary | ICD-10-CM | POA: Insufficient documentation

## 2016-02-24 DIAGNOSIS — Z3A37 37 weeks gestation of pregnancy: Secondary | ICD-10-CM | POA: Diagnosis not present

## 2016-02-24 DIAGNOSIS — Z3493 Encounter for supervision of normal pregnancy, unspecified, third trimester: Secondary | ICD-10-CM

## 2016-02-24 DIAGNOSIS — O99210 Obesity complicating pregnancy, unspecified trimester: Secondary | ICD-10-CM

## 2016-02-24 DIAGNOSIS — Z3490 Encounter for supervision of normal pregnancy, unspecified, unspecified trimester: Secondary | ICD-10-CM

## 2016-02-24 NOTE — Patient Instructions (Signed)
Return to clinic for any scheduled appointments or obstetric concerns, or go to [facility] for evaluation  

## 2016-02-24 NOTE — Progress Notes (Signed)
   PRENATAL VISIT NOTE  Subjective:  Melissa Chan is a 26 y.o. [id number] at 6349w5d being seen today for ongoing prenatal care.  She is currently monitored for the following issues for this low-risk pregnancy and has Supervision of normal pregnancy, antepartum; Obesity affecting pregnancy, antepartum; and Gestational diabetes mellitus on her problem list.  Patient reports no complaints.  Contractions: Irregular. Vag. Bleeding: None.  Movement: Present. Denies leaking of fluid.   The following portions of the patient's history were reviewed and updated as appropriate: allergies, current medications, past family history, past medical history, past social history, past surgical history and problem list. Problem list updated.  Objective:   Vitals:   02/24/16 1344  BP: 107/76  Pulse: 99  Temp: 97.9 F (36.6 C)  Weight: 274 lb 8 oz (124.5 kg)    Fetal Status: Fetal Heart Rate (bpm): 134 Fundal Height: 39 cm Movement: Present     General:  Alert, oriented and cooperative. Patient is in no acute distress.  Skin: Skin is warm and dry. No rash noted.   Cardiovascular: Normal heart rate noted  Respiratory: Normal respiratory effort, no problems with respiration noted  Abdomen: Soft, gravid, appropriate for gestational age. Pain/Pressure: Present     Pelvic:  Cervical exam deferred        Extremities: Normal range of motion.  Edema: None  Mental Status: Normal mood and affect. Normal behavior. Normal judgment and thought content.   Assessment and Plan:  Pregnancy: Y7W2956G5P3104 at 2649w5d  1. Encounter for supervision of normal pregnancy, antepartum, unspecified gravidity Scheduled for growth scan today, will follow up results and manage accordingly. Term labor symptoms and general obstetric precautions including but not limited to vaginal bleeding, contractions, leaking of fluid and fetal movement were reviewed in detail with the patient. Please refer to After Visit Summary for other counseling  recommendations.  Return in about 1 week (around 03/02/2016) for OB Visit.   [name], MD

## 2016-03-02 ENCOUNTER — Ambulatory Visit (INDEPENDENT_AMBULATORY_CARE_PROVIDER_SITE_OTHER): Payer: Medicaid Other | Admitting: Obstetrics and Gynecology

## 2016-03-02 ENCOUNTER — Encounter (HOSPITAL_COMMUNITY): Payer: Self-pay | Admitting: *Deleted

## 2016-03-02 ENCOUNTER — Telehealth (HOSPITAL_COMMUNITY): Payer: Self-pay | Admitting: *Deleted

## 2016-03-02 VITALS — BP 113/78 | HR 100 | Temp 97.1°F | Wt 276.4 lb

## 2016-03-02 DIAGNOSIS — O24410 Gestational diabetes mellitus in pregnancy, diet controlled: Secondary | ICD-10-CM

## 2016-03-02 DIAGNOSIS — Z3480 Encounter for supervision of other normal pregnancy, unspecified trimester: Secondary | ICD-10-CM

## 2016-03-02 NOTE — Progress Notes (Signed)
Patient states that she feels contraction off and on in lower back, denies bleeding.

## 2016-03-02 NOTE — Patient Instructions (Signed)

## 2016-03-02 NOTE — Progress Notes (Signed)
Subjective:  Melissa Chan is a 26 y.o. [id number] at 5573w5d being seen today for ongoing prenatal care.  She is currently monitored for the following issues for this high-risk pregnancy and has Supervision of normal pregnancy, antepartum; Obesity affecting pregnancy, antepartum; and Gestational diabetes mellitus on her problem list.  Patient reports no complaints.  Contractions: Irregular. Vag. Bleeding: None.  Movement: Present. Denies leaking of fluid.   The following portions of the patient's history were reviewed and updated as appropriate: allergies, current medications, past family history, past medical history, past social history, past surgical history and problem list. Problem list updated.  Objective:   Vitals:   03/02/16 1111  BP: 113/78  Pulse: 100  Temp: 97.1 F (36.2 C)  Weight: 276 lb 6.4 oz (125.4 kg)    Fetal Status: Fetal Heart Rate (bpm): 145   Movement: Present     General:  Alert, oriented and cooperative. Patient is in no acute distress.  Skin: Skin is warm and dry. No rash noted.   Cardiovascular: Normal heart rate noted  Respiratory: Normal respiratory effort, no problems with respiration noted  Abdomen: Soft, gravid, appropriate for gestational age. Pain/Pressure: Present     Pelvic:  Cervical exam deferred        Extremities: Normal range of motion.  Edema: None  Mental Status: Normal mood and affect. Normal behavior. Normal judgment and thought content.   Urinalysis:      Assessment and Plan:  Pregnancy: L2G4010G5P3104 at 4273w5d  1. Supervision of other normal pregnancy, antepartum Labor Precautions   2. Diet controlled gestational diabetes mellitus (GDM) in third trimester BS in goal range per pt but did not bring #'s U/S 3685 gms 90% IOL scheduled at 40 wks  Term labor symptoms and general obstetric precautions including but not limited to vaginal bleeding, contractions, leaking of fluid and fetal movement were reviewed in detail with the  patient. Please refer to After Visit Summary for other counseling recommendations.  No Follow-up on file.   Hermina StaggersMichael L Yarely Bebee, MD

## 2016-03-02 NOTE — Telephone Encounter (Signed)
Preadmission screen  

## 2016-03-04 ENCOUNTER — Encounter (HOSPITAL_COMMUNITY): Payer: Self-pay | Admitting: *Deleted

## 2016-03-04 ENCOUNTER — Inpatient Hospital Stay (HOSPITAL_COMMUNITY)
Admission: AD | Admit: 2016-03-04 | Discharge: 2016-03-04 | Disposition: A | Discharge status: Home / Self Care | Payer: Medicaid Other | Source: Ambulatory Visit | Attending: Obstetrics and Gynecology | Admitting: Obstetrics and Gynecology

## 2016-03-04 DIAGNOSIS — B379 Candidiasis, unspecified: Secondary | ICD-10-CM | POA: Insufficient documentation

## 2016-03-04 DIAGNOSIS — O4292 Full-term premature rupture of membranes, unspecified as to length of time between rupture and onset of labor: Secondary | ICD-10-CM | POA: Diagnosis present

## 2016-03-04 DIAGNOSIS — Z3A39 39 weeks gestation of pregnancy: Secondary | ICD-10-CM | POA: Diagnosis not present

## 2016-03-04 DIAGNOSIS — O98813 Other maternal infectious and parasitic diseases complicating pregnancy, third trimester: Secondary | ICD-10-CM | POA: Insufficient documentation

## 2016-03-04 DIAGNOSIS — O23593 Infection of other part of genital tract in pregnancy, third trimester: Secondary | ICD-10-CM | POA: Diagnosis not present

## 2016-03-04 DIAGNOSIS — Z0371 Encounter for suspected problem with amniotic cavity and membrane ruled out: Secondary | ICD-10-CM

## 2016-03-04 NOTE — Discharge Instructions (Signed)
Braxton Hicks Contractions Contractions of the uterus can occur throughout pregnancy. Contractions are not always a sign that you are in labor.  WHAT ARE BRAXTON HICKS CONTRACTIONS?  Contractions that occur before labor are called Braxton Hicks contractions, or false labor. Toward the end of pregnancy (32-34 weeks), these contractions can develop more often and may become more forceful. This is not true labor because these contractions do not result in opening (dilatation) and thinning of the cervix. They are sometimes difficult to tell apart from true labor because these contractions can be forceful and people have different pain tolerances. You should not feel embarrassed if you go to the hospital with false labor. Sometimes, the only way to tell if you are in true labor is for your health care provider to look for changes in the cervix. If there are no prenatal problems or other health problems associated with the pregnancy, it is completely safe to be sent home with false labor and await the onset of true labor. HOW CAN YOU TELL THE DIFFERENCE BETWEEN TRUE AND FALSE LABOR? False Labor   The contractions of false labor are usually shorter and not as hard as those of true labor.   The contractions are usually irregular.   The contractions are often felt in the front of the lower abdomen and in the groin.   The contractions may go away when you walk around or change positions while lying down.   The contractions get weaker and are shorter lasting as time goes on.   The contractions do not usually become progressively stronger, regular, and closer together as with true labor.  True Labor   Contractions in true labor last 30-70 seconds, become very regular, usually become more intense, and increase in frequency.   The contractions do not go away with walking.   The discomfort is usually felt in the top of the uterus and spreads to the lower abdomen and low back.   True labor can be  determined by your health care provider with an exam. This will show that the cervix is dilating and getting thinner.  WHAT TO REMEMBER  Keep up with your usual exercises and follow other instructions given by your health care provider.   Take medicines as directed by your health care provider.   Keep your regular prenatal appointments.   Eat and drink lightly if you think you are going into labor.   If Braxton Hicks contractions are making you uncomfortable:   Change your position from lying down or resting to walking, or from walking to resting.   Sit and rest in a tub of warm water.   Drink 2-3 glasses of water. Dehydration may cause these contractions.   Do slow and deep breathing several times an hour.  WHEN SHOULD I SEEK IMMEDIATE MEDICAL CARE? Seek immediate medical care if:  Your contractions become stronger, more regular, and closer together.   You have fluid leaking or gushing from your vagina.   You have a fever.   You pass blood-tinged mucus.   You have vaginal bleeding.   You have continuous abdominal pain.   You have low back pain that you never had before.   You feel your baby's head pushing down and causing pelvic pressure.   Your baby is not moving as much as it used to.  This information is not intended to replace advice given to you by your health care provider. Make sure you discuss any questions you have with your health care   provider. Document Released: 01/11/2005 Document Revised: 05/05/2015 Document Reviewed: 10/23/2012 Elsevier Interactive Patient Education  2017 [employer] Introduction Patient Name: ________________________________________________ Patient Due Date: ____________________ What is a fetal movement count? A fetal movement count is the number of times that you feel your baby move during a certain amount of time. This may also be called a fetal kick count. A fetal movement count is recommended for every pregnant  woman. You may be asked to start counting fetal movements as early as week 28 of your pregnancy. Pay attention to when your baby is most active. You may notice your baby's sleep and wake cycles. You may also notice things that make your baby move more. You should do a fetal movement count:  When your baby is normally most active.  At the same time each day. A good time to count movements is while you are resting, after having something to eat and drink. How do I count fetal movements? 1. Find a quiet, comfortable area. Sit, or lie down on your side. 2. Write down the date, the start time and stop time, and the number of movements that you felt between those two times. Take this information with you to your health care visits. 3. For 2 hours, count kicks, flutters, swishes, rolls, and jabs. You should feel at least 10 movements during 2 hours. 4. You may stop counting after you have felt 10 movements. 5. If you do not feel 10 movements in 2 hours, have something to eat and drink. Then, keep resting and counting for 1 hour. If you feel at least 4 movements during that hour, you may stop counting. Contact a health care provider if:  You feel fewer than 4 movements in 2 hours.  Your baby is not moving like he or she usually does. Date: ____________ Start time: ____________ Stop time: ____________ Movements: ____________ Date: ____________ Start time: ____________ Stop time: ____________ Movements: ____________ Date: ____________ Start time: ____________ Stop time: ____________ Movements: ____________ Date: ____________ Start time: ____________ Stop time: ____________ Movements: ____________ Date: ____________ Start time: ____________ Stop time: ____________ Movements: ____________ Date: ____________ Start time: ____________ Stop time: ____________ Movements: ____________ Date: ____________ Start time: ____________ Stop time: ____________ Movements: ____________ Date: ____________ Start time:  ____________ Stop time: ____________ Movements: ____________ Date: ____________ Start time: ____________ Stop time: ____________ Movements: ____________ This information is not intended to replace advice given to you by your health care provider. Make sure you discuss any questions you have with your health care provider. Document Released: 02/10/2006 Document Revised: 09/10/2015 Document Reviewed: 02/20/2015 Elsevier Interactive Patient Education  2017 [employer] Inc[employer]  

## 2016-03-04 NOTE — MAU Provider Note (Signed)
None     Chief Complaint:  Rupture of Membranes   Melissa Chan Funnell is  26 y.o. [id number] at 3199w0d presents complaining of Rupture of Membranes and some contractions.  She has had 'some liquid" leaking "off and on" today.  She states irregular, every 2-10 minutes contractions are associated with none vaginal bleeding, intact membranes, along with active fetal movement.   Obstetrical/Gynecological History: OB History    Gravida Para Term Preterm AB Living   5 4 3 1   4    SAB TAB Ectopic Multiple Live Births         0 4     Past Medical History: Past Medical History:  Diagnosis Date  . Medical history non-contributory     Past Surgical History: Past Surgical History:  Procedure Laterality Date  . NO PAST SURGERIES      Family History: Family History  Problem Relation Age of Onset  . Hypertension Mother   . Hypertension Father   . Diabetes Father   . Cancer Maternal Grandmother   . Heart disease Paternal Grandmother     Social History: Social History  Substance Use Topics  . Smoking status: Never Smoker  . Smokeless tobacco: Never Used  . Alcohol use No    Allergies: No Known Allergies  Meds:  Prescriptions Prior to Admission  Medication Sig Dispense Refill Last Dose  . Prenat-FeCbn-FeAspGl-FA-Omega (OB COMPLETE PETITE) 35-5-1-200 MG CAPS Take 1 capsule by mouth daily before breakfast. (Patient not taking: Reported on 03/02/2016) 90 capsule 3 Not Taking  . Prenatal Vit-Fe Fumarate-FA (MULTIVITAMIN-PRENATAL) 27-0.8 MG TABS tablet Take 1 tablet by mouth daily at 12 noon.   Taking    Review of Systems   Constitutional: Negative for fever and chills Eyes: Negative for visual disturbances Respiratory: Negative for shortness of breath, dyspnea Cardiovascular: Negative for chest pain or palpitations  Gastrointestinal: Negative for vomiting, diarrhea and constipation Genitourinary: Negative for dysuria and urgency Musculoskeletal: Negative for back pain, joint  pain, myalgias.  Normal ROM  Neurological: Negative for dizziness and headaches    Physical Exam  Blood pressure 113/69, pulse 100, temperature 98 F (36.7 C), temperature source Oral, resp. rate 18, height 5\' 9"  (1.753 m), weight 125.6 kg (277 lb), last menstrual period 06/05/2015, unknown if currently breastfeeding. GENERAL: Well-developed, well-nourished female in no acute distress.  LUNGS: Clear to auscultation bilaterally.  HEART: Regular rate and rhythm. ABDOMEN: Soft, nontender, nondistended, gravid.  EXTREMITIES: Nontender, no edema, 2+ distal pulses. DTR's 2+ SSE:  Heavy yeast, no pooling, neg valsalva/fern CERVICAL EXAM: Dilatation 3cm   Effacement 0%   Station -3   Presentation: cephalic FHT:  Baseline rate 150 bpm   Variability moderate  Accelerations present   Decelerations none Contractions: Every 2-3 mins, mild Labs: No results found for this or any previous visit (from the past 24 hour(s)). Imaging Studies:    Assessment: Melissa Chan Wahlquist is  26 y.o. [id number] at 1699w0d presents with yeast infection. Not in labor.  No evidence of ROM  Plan: OTC meds for yeast infection Labor precautions  CRESENZO-DISHMAN,Jeimy Bickert 2/8/20189:11 PM

## 2016-03-04 NOTE — MAU Note (Signed)
PT  SAYS SHE  THINKS  SROM -  SINCE   0700-   CLEAR.-  STILL FEELS   A  LITTLE  COMING  OUT.    PNC-  WITH [facility]INA-     NO VE .      DENIES HSV AND MRSA.  GBS- NEG

## 2016-03-04 NOTE — MAU Note (Signed)
Pt c/o ?LOF this morning. Some contractions. Denies vag bleeding. +FM

## 2016-03-09 ENCOUNTER — Ambulatory Visit (INDEPENDENT_AMBULATORY_CARE_PROVIDER_SITE_OTHER): Payer: Medicaid Other | Admitting: Obstetrics & Gynecology

## 2016-03-09 VITALS — BP 107/73 | HR 92 | Temp 97.1°F | Wt 276.5 lb

## 2016-03-09 DIAGNOSIS — O24410 Gestational diabetes mellitus in pregnancy, diet controlled: Secondary | ICD-10-CM

## 2016-03-09 NOTE — Progress Notes (Signed)
   PRENATAL VISIT NOTE  Subjective:  Melissa Chan is a 26 y.o. [id number] at 3745w5d being seen today for ongoing prenatal care.  She is currently monitored for the following issues for this high-risk pregnancy and has Supervision of normal pregnancy, antepartum; Obesity affecting pregnancy, antepartum; and Gestational diabetes mellitus on her problem list.  Patient reports occasional contractions.  Contractions: Irregular. Vag. Bleeding: None.  Movement: Present. Denies leaking of fluid.   The following portions of the patient's history were reviewed and updated as appropriate: allergies, current medications, past family history, past medical history, past social history, past surgical history and problem list. Problem list updated.  Objective:   Vitals:   03/09/16 1547  BP: 107/73  Pulse: 92  Temp: 97.1 F (36.2 C)  Weight: 276 lb 8 oz (125.4 kg)    Fetal Status: Fetal Heart Rate (bpm): 154   Movement: Present     General:  Alert, oriented and cooperative. Patient is in no acute distress.  Skin: Skin is warm and dry. No rash noted.   Cardiovascular: Normal heart rate noted  Respiratory: Normal respiratory effort, no problems with respiration noted  Abdomen: Soft, gravid, appropriate for gestational age. Pain/Pressure: Present     Pelvic:  Cervical exam deferred        Extremities: Normal range of motion.  Edema: Trace  Mental Status: Normal mood and affect. Normal behavior. Normal judgment and thought content.   Assessment and Plan:  Pregnancy: [id number] at 4545w5d  1. Diet controlled gestational diabetes mellitus (GDM) in third trimester IOL in 2 days   Term labor symptoms and general obstetric precautions including but not limited to vaginal bleeding, contractions, leaking of fluid and fetal movement were reviewed in detail with the patient. Please refer to After Visit Summary for other counseling recommendations.  Return in about 6 weeks (around 04/20/2016) for  postpartum.   Adam PhenixJames G Asha Grumbine, MD

## 2016-03-09 NOTE — Patient Instructions (Signed)
Labor Induction Labor induction is when steps are taken to cause a pregnant woman to begin the labor process. Most women go into labor on their own between 37 weeks and 42 weeks of the pregnancy. When this does not happen or when there is a medical need, methods may be used to induce labor. Labor induction causes a pregnant woman's uterus to contract. It also causes the cervix to soften (ripen), open (dilate), and thin out (efface). Usually, labor is not induced before 39 weeks of the pregnancy unless there is a problem with the baby or mother. Before inducing labor, your health care provider will consider a number of factors, including the following:  The medical condition of you and the baby.  How many weeks along you are.  The status of the baby's lung maturity.  The condition of the cervix.  The position of the baby. What are the reasons for labor induction? Labor may be induced for the following reasons:  The health of the baby or mother is at risk.  The pregnancy is overdue by 1 week or more.  The water breaks but labor does not start on its own.  The mother has a health condition or serious illness, such as high blood pressure, infection, placental abruption, or diabetes.  The amniotic fluid amounts are low around the baby.  The baby is distressed. Convenience or wanting the baby to be born on a certain date is not a reason for inducing labor. What methods are used for labor induction? Several methods of labor induction may be used, such as:  Prostaglandin medicine. This medicine causes the cervix to dilate and ripen. The medicine will also start contractions. It can be taken by mouth or by inserting a suppository into the vagina.  Inserting a thin tube (catheter) with a balloon on the end into the vagina to dilate the cervix. Once inserted, the balloon is expanded with water, which causes the cervix to open.  Stripping the membranes. Your health care provider separates  amniotic sac tissue from the cervix, causing the cervix to be stretched and causing the release of a hormone called progesterone. This may cause the uterus to contract. It is often done during an office visit. You will be sent home to wait for the contractions to begin. You will then come in for an induction.  Breaking the water. Your health care provider makes a hole in the amniotic sac using a small instrument. Once the amniotic sac breaks, contractions should begin. This may still take hours to see an effect.  Medicine to trigger or strengthen contractions. This medicine is given through an IV access tube inserted into a vein in your arm. All of the methods of induction, besides stripping the membranes, will be done in the hospital. Induction is done in the hospital so that you and the baby can be carefully monitored. How long does it take for labor to be induced? Some inductions can take up to 2-3 days. Depending on the cervix, it usually takes less time. It takes longer when you are induced early in the pregnancy or if this is your first pregnancy. If a mother is still pregnant and the induction has been going on for 2-3 days, either the mother will be sent home or a cesarean delivery will be needed. What are the risks associated with labor induction? Some of the risks of induction include:  Changes in fetal heart rate, such as too high, too low, or erratic.  Fetal distress.    Chance of infection for the mother and baby.  Increased chance of having a cesarean delivery.  Breaking off (abruption) of the placenta from the uterus (rare).  Uterine rupture (very rare). When induction is needed for medical reasons, the benefits of induction may outweigh the risks. What are some reasons for not inducing labor? Labor induction should not be done if:  It is shown that your baby does not tolerate labor.  You have had previous surgeries on your uterus, such as a myomectomy or the removal of  fibroids.  Your placenta lies very low in the uterus and blocks the opening of the cervix (placenta previa).  Your baby is not in a head-down position.  The umbilical cord drops down into the birth canal in front of the baby. This could cut off the baby's blood and oxygen supply.  You have had a previous cesarean delivery.  There are unusual circumstances, such as the baby being extremely premature. This information is not intended to replace advice given to you by your health care provider. Make sure you discuss any questions you have with your health care provider. Document Released: 06/02/2006 Document Revised: 06/19/2015 Document Reviewed: 08/10/2012 Elsevier Interactive Patient Education  2017 [employer] Inc.  

## 2016-03-10 ENCOUNTER — Other Ambulatory Visit: Payer: Self-pay | Admitting: Advanced Practice Midwife

## 2016-03-11 ENCOUNTER — Inpatient Hospital Stay (HOSPITAL_COMMUNITY)
Admission: RE | Admit: 2016-03-11 | Discharge: 2016-03-13 | DRG: 767 | Disposition: A | Discharge status: Home / Self Care | Payer: Medicaid Other | Source: Ambulatory Visit | Attending: Family Medicine | Admitting: Family Medicine

## 2016-03-11 ENCOUNTER — Encounter (HOSPITAL_COMMUNITY): Payer: Self-pay

## 2016-03-11 DIAGNOSIS — O26893 Other specified pregnancy related conditions, third trimester: Secondary | ICD-10-CM | POA: Diagnosis present

## 2016-03-11 DIAGNOSIS — Z6791 Unspecified blood type, Rh negative: Secondary | ICD-10-CM | POA: Diagnosis not present

## 2016-03-11 DIAGNOSIS — O99214 Obesity complicating childbirth: Secondary | ICD-10-CM | POA: Diagnosis present

## 2016-03-11 DIAGNOSIS — Z3A40 40 weeks gestation of pregnancy: Secondary | ICD-10-CM

## 2016-03-11 DIAGNOSIS — O3663X0 Maternal care for excessive fetal growth, third trimester, not applicable or unspecified: Secondary | ICD-10-CM | POA: Diagnosis present

## 2016-03-11 DIAGNOSIS — Z833 Family history of diabetes mellitus: Secondary | ICD-10-CM | POA: Diagnosis not present

## 2016-03-11 DIAGNOSIS — Z302 Encounter for sterilization: Secondary | ICD-10-CM

## 2016-03-11 DIAGNOSIS — Z6841 Body Mass Index (BMI) 40.0 and over, adult: Secondary | ICD-10-CM | POA: Diagnosis not present

## 2016-03-11 DIAGNOSIS — O24429 Gestational diabetes mellitus in childbirth, unspecified control: Secondary | ICD-10-CM

## 2016-03-11 DIAGNOSIS — O24420 Gestational diabetes mellitus in childbirth, diet controlled: Secondary | ICD-10-CM | POA: Diagnosis present

## 2016-03-11 DIAGNOSIS — Z8249 Family history of ischemic heart disease and other diseases of the circulatory system: Secondary | ICD-10-CM | POA: Diagnosis not present

## 2016-03-11 DIAGNOSIS — O99210 Obesity complicating pregnancy, unspecified trimester: Secondary | ICD-10-CM

## 2016-03-11 DIAGNOSIS — O24410 Gestational diabetes mellitus in pregnancy, diet controlled: Secondary | ICD-10-CM | POA: Diagnosis present

## 2016-03-11 LAB — TYPE AND SCREEN
ABO/RH(D): O POS
ANTIBODY SCREEN: NEGATIVE

## 2016-03-11 LAB — CBC
HCT: 30.5 % — ABNORMAL LOW (ref 36.0–46.0)
HEMOGLOBIN: 9.8 g/dL — AB (ref 12.0–15.0)
MCH: 24.9 pg — ABNORMAL LOW (ref 26.0–34.0)
MCHC: 32.1 g/dL (ref 30.0–36.0)
MCV: 77.6 fL — ABNORMAL LOW (ref 78.0–100.0)
PLATELETS: 246 10*3/uL (ref 150–400)
RBC: 3.93 MIL/uL (ref 3.87–5.11)
RDW: 14.8 % (ref 11.5–15.5)
WBC: 7 10*3/uL (ref 4.0–10.5)

## 2016-03-11 LAB — GLUCOSE, CAPILLARY
Glucose-Capillary: 101 mg/dL — ABNORMAL HIGH (ref 65–99)
Glucose-Capillary: 107 mg/dL — ABNORMAL HIGH (ref 65–99)
Glucose-Capillary: 110 mg/dL — ABNORMAL HIGH (ref 65–99)

## 2016-03-11 MED ORDER — ONDANSETRON HCL 4 MG/2ML IJ SOLN: 4.0000 mg | Freq: Four times a day (QID) | INTRAMUSCULAR | Status: DC | PRN

## 2016-03-11 MED ORDER — DIBUCAINE 1 % RE OINT: 1.0000 "application " | TOPICAL_OINTMENT | RECTAL | Status: DC | PRN

## 2016-03-11 MED ORDER — OXYTOCIN 40 UNITS IN LACTATED RINGERS INFUSION - SIMPLE MED: 2.5000 [IU]/h | INTRAVENOUS | Status: DC | PRN

## 2016-03-11 MED ORDER — ACETAMINOPHEN 325 MG PO TABS
650.0000 mg | ORAL_TABLET | ORAL | Status: DC | PRN
  Administered 2016-03-12 (×2): 650 mg via ORAL
  Filled 2016-03-11 (×2): qty 2

## 2016-03-11 MED ORDER — TERBUTALINE SULFATE 1 MG/ML IJ SOLN
0.2500 mg | Freq: Once | INTRAMUSCULAR | Status: DC | PRN
  Filled 2016-03-11: qty 1

## 2016-03-11 MED ORDER — FLEET ENEMA 7-19 GM/118ML RE ENEM: 1.0000 | ENEMA | RECTAL | Status: DC | PRN

## 2016-03-11 MED ORDER — OXYCODONE-ACETAMINOPHEN 5-325 MG PO TABS: 2.0000 | ORAL_TABLET | ORAL | Status: DC | PRN

## 2016-03-11 MED ORDER — BENZOCAINE-MENTHOL 20-0.5 % EX AERO
1.0000 "application " | INHALATION_SPRAY | CUTANEOUS | Status: DC | PRN
  Administered 2016-03-11: 1 via TOPICAL
  Filled 2016-03-11: qty 56

## 2016-03-11 MED ORDER — ONDANSETRON HCL 4 MG PO TABS: 4.0000 mg | ORAL_TABLET | ORAL | Status: DC | PRN

## 2016-03-11 MED ORDER — FENTANYL CITRATE (PF) 100 MCG/2ML IJ SOLN
50.0000 ug | INTRAMUSCULAR | Status: DC | PRN
  Filled 2016-03-11 (×2): qty 2

## 2016-03-11 MED ORDER — WITCH HAZEL-GLYCERIN EX PADS: 1.0000 "application " | MEDICATED_PAD | CUTANEOUS | Status: DC | PRN

## 2016-03-11 MED ORDER — LIDOCAINE HCL (PF) 1 % IJ SOLN
30.0000 mL | INTRAMUSCULAR | Status: DC | PRN
  Filled 2016-03-11: qty 30

## 2016-03-11 MED ORDER — ONDANSETRON HCL 4 MG/2ML IJ SOLN: 4.0000 mg | INTRAMUSCULAR | Status: DC | PRN

## 2016-03-11 MED ORDER — COCONUT OIL OIL: 1.0000 "application " | TOPICAL_OIL | Status: DC | PRN

## 2016-03-11 MED ORDER — IBUPROFEN 600 MG PO TABS
600.0000 mg | ORAL_TABLET | Freq: Four times a day (QID) | ORAL | Status: DC
  Administered 2016-03-11 – 2016-03-13 (×6): 600 mg via ORAL
  Filled 2016-03-11 (×6): qty 1

## 2016-03-11 MED ORDER — SENNOSIDES-DOCUSATE SODIUM 8.6-50 MG PO TABS
2.0000 | ORAL_TABLET | ORAL | Status: DC
  Administered 2016-03-12 (×2): 2 via ORAL
  Filled 2016-03-11 (×2): qty 2

## 2016-03-11 MED ORDER — SOD CITRATE-CITRIC ACID 500-334 MG/5ML PO SOLN: 30.0000 mL | ORAL | Status: DC | PRN

## 2016-03-11 MED ORDER — OXYTOCIN 40 UNITS IN LACTATED RINGERS INFUSION - SIMPLE MED
1.0000 m[IU]/min | INTRAVENOUS | Status: DC
  Administered 2016-03-11: 2 m[IU]/min via INTRAVENOUS
  Filled 2016-03-11: qty 1000

## 2016-03-11 MED ORDER — DIPHENHYDRAMINE HCL 25 MG PO CAPS: 25.0000 mg | ORAL_CAPSULE | Freq: Four times a day (QID) | ORAL | Status: DC | PRN

## 2016-03-11 MED ORDER — LACTATED RINGERS IV SOLN
INTRAVENOUS | Status: DC
  Administered 2016-03-11: 08:00:00 via INTRAVENOUS

## 2016-03-11 MED ORDER — SODIUM CHLORIDE 0.9% FLUSH
3.0000 mL | Freq: Two times a day (BID) | INTRAVENOUS | Status: DC
  Administered 2016-03-11: 3 mL via INTRAVENOUS

## 2016-03-11 MED ORDER — SIMETHICONE 80 MG PO CHEW: 80.0000 mg | CHEWABLE_TABLET | ORAL | Status: DC | PRN

## 2016-03-11 MED ORDER — SODIUM CHLORIDE 0.9 % IV SOLN: 250.0000 mL | INTRAVENOUS | Status: DC | PRN

## 2016-03-11 MED ORDER — LACTATED RINGERS IV SOLN: 500.0000 mL | INTRAVENOUS | Status: DC | PRN

## 2016-03-11 MED ORDER — OXYTOCIN BOLUS FROM INFUSION
500.0000 mL | Freq: Once | INTRAVENOUS | Status: AC
  Administered 2016-03-11: 500 mL via INTRAVENOUS

## 2016-03-11 MED ORDER — ACETAMINOPHEN 325 MG PO TABS: 650.0000 mg | ORAL_TABLET | ORAL | Status: DC | PRN

## 2016-03-11 MED ORDER — OXYCODONE-ACETAMINOPHEN 5-325 MG PO TABS: 1.0000 | ORAL_TABLET | ORAL | Status: DC | PRN

## 2016-03-11 MED ORDER — SODIUM CHLORIDE 0.9% FLUSH: 3.0000 mL | INTRAVENOUS | Status: DC | PRN

## 2016-03-11 MED ORDER — TETANUS-DIPHTH-ACELL PERTUSSIS 5-2.5-18.5 LF-MCG/0.5 IM SUSP: 0.5000 mL | Freq: Once | INTRAMUSCULAR | Status: DC

## 2016-03-11 MED ORDER — PRENATAL MULTIVITAMIN CH
1.0000 | ORAL_TABLET | Freq: Every day | ORAL | Status: DC
  Administered 2016-03-13: 1 via ORAL
  Filled 2016-03-11: qty 1

## 2016-03-11 MED ORDER — OXYTOCIN 40 UNITS IN LACTATED RINGERS INFUSION - SIMPLE MED
2.5000 [IU]/h | INTRAVENOUS | Status: DC
  Administered 2016-03-11: 2.5 [IU]/h via INTRAVENOUS

## 2016-03-11 MED ORDER — ZOLPIDEM TARTRATE 5 MG PO TABS
5.0000 mg | ORAL_TABLET | Freq: Every evening | ORAL | Status: DC | PRN
Start: 2016-03-11 — End: 2016-03-13

## 2016-03-11 NOTE — Lactation Note (Signed)
This note was copied from a baby's chart. Lactation Consultation Note  Patient Name: Melissa Chan WUJWJ'XToday's Date: 03/11/2016 Reason for consult: Initial assessment   Initial assessment with Exp BF [facility]. Mom reports she BF her 26 yo for 6 months.   Mom latched infant to right breast in the football hold independently. Infant noted to be shallow and mostly on nipple. Enc mom to wait for wide open mouth and to make sure infant takes nipple and areola in the mouth. Discussed importance of deep latch to minimize nipple tenderness and maximize milk transfer. Mom voiced understanding and allowed infant to be mostly on nipple. Mom supported infant well at the breast. Mom with large breasts and large everted nipples.Mom reports she is aware of how to hand express. Mom again latched infant in shallow position. Again enc mom to wait for wide open mouth and assist infant in obtaining a deeper latch. Infant was not suckling but holding nipple in her mouth.   Enc mom to feed infant STS 8-12 x in 24 hours at first feeding cues and discussed normalcy of cluster feeding. Enc mom to hand express prior to infant latch. BF Resources Handouts and LC Brochure given, mom informed of IP/OP Services, BF Support Groups and LC phone #. Mom without questions/concerns at this time. [facility] and was informed to call and make appt post d/c.    After about 10 minutes, Mom then turned her attention to getting food and was not communicating with LC. Mom will most likely need reiteration of basic teaching. Enc mom to call out for assistance as needed.    Maternal Data Formula Feeding for Exclusion: Yes Reason for exclusion: Mother's choice to formula and breast feed on admission Has patient been taught Hand Expression?: Yes Does the patient have breastfeeding experience prior to this delivery?: Yes  Feeding Feeding Type: Breast Fed Length of feed: 10 min (still feeding  when I left the room)  LATCH Score/Interventions Latch: Repeated attempts needed to sustain latch, nipple held in mouth throughout feeding, stimulation needed to elicit sucking reflex. Intervention(s): Adjust position;Assist with latch;Breast massage;Breast compression  Audible Swallowing: A few with stimulation Intervention(s): Alternate breast massage;Hand expression;Skin to skin  Type of Nipple: Everted at rest and after stimulation  Comfort (Breast/Nipple): Soft / non-tender     Hold (Positioning): No assistance needed to correctly position infant at breast.  LATCH Score: 8  Lactation Tools Discussed/Used WIC Program: Yes   Consult Status Consult Status: Follow-up Date: 03/12/16 Follow-up type: In-patient    Silas FloodSharon S Dashon Mcintire 03/11/2016, 4:52 PM

## 2016-03-11 NOTE — H&P (Signed)
Faculty Practice H&P  Melissa Chan is a 26 y.o. female [phone] with IUP at [redacted]w[redacted]d presenting for IOL secondary to A1 GDM. Pregnancy was been complicated by LGA - EFW at [redacted]w[redacted]d was 8#2oz (90% with AC > 97%). Proven pelvis to 8#14oz.    Pt states she has been having occasional contractions, no vaginal bleeding, intact membranes, with normal fetal movement.     Prenatal Course Source of Care: [facility] [place]  with onset of care at 22 weeks Pregnancy complications or risks: Patient Active Problem List   Diagnosis Date Noted  . Gestational diabetes, diet controlled 03/11/2016  . Gestational diabetes mellitus 01/28/2016  . Obesity affecting pregnancy, antepartum 11/27/2015  . Supervision of normal pregnancy, antepartum 10/30/2015   She desires to bilateral tubal ligation.  She plans to plans to breastfeed  Prenatal labs and studies: ABO, Rh: O/Positive/-- (10/05 1502) Antibody: Negative (10/05 1502) Rubella: !Error! RPR: Non Reactive (12/13 1105)  HBsAg: Negative (10/05 1502)  HIV: Non Reactive (12/13 1105)  GBS: Negative (01/24 1423)  2hr GTT: 115, 186, 218 Genetic screeningnormal Anatomy US normal  Past Medical History:  Past Medical History:  Diagnosis Date  . Medical history non-contributory     Past Surgical History:  Past Surgical History:  Procedure Laterality Date  . NO PAST SURGERIES      Obstetrical History:  OB History    Gravida Para Term Preterm AB Living   5 4 3 1   4    SAB TAB Ectopic Multiple Live Births         0 4      Gynecological History:  OB History    Gravida Para Term Preterm AB Living   5 4 3 1   4    SAB TAB Ectopic Multiple Live Births         0 4      Social History:  Social History   Social History  . Marital status: Legally Separated    Spouse name: N/A  . Number of children: N/A  . Years of education: N/A   Social History Main Topics  . Smoking status: Never Smoker  . Smokeless tobacco: Never Used  . Alcohol use No  .  Drug use: No  . Sexual activity: Yes    Birth control/ protection: None   Other Topics Concern  . None   Social History Narrative  . None    Family History:  Family History  Problem Relation Age of Onset  . Hypertension Mother   . Hypertension Father   . Diabetes Father   . Cancer Maternal Grandmother   . Heart disease Paternal Grandmother     Medications:  Prenatal vitamins,  Current Facility-Administered Medications  Medication Dose Route Frequency Provider Last Rate Last Dose  . acetaminophen (TYLENOL) tablet 650 mg  650 mg Oral Q4H PRN [facility], DO      . fentaNYL (SUBLIMAZE) injection 50-100 mcg  50-100 mcg Intravenous Q1H PRN [facility], DO      . lactated ringers infusion 500-1,000 mL  500-1,000 mL Intravenous PRN Hiram Comber Mumaw, DO      . lactated ringers infusion   Intravenous Continuous [facility], DO      . lidocaine (PF) (XYLOCAINE) 1 % injection 30 mL  30 mL Subcutaneous PRN [facility], DO      . ondansetron [facility]) injection 4 mg  4 mg Intravenous Q6H PRN [facility], DO      .  oxyCODONE-acetaminophen (PERCOCET/ROXICET) 5-325 MG per tablet 1 tablet  1 tablet Oral Q4H PRN Hiram ComberElizabeth Woodland Mumaw, DO      . oxyCODONE-acetaminophen (PERCOCET/ROXICET) 5-325 MG per tablet 2 tablet  2 tablet Oral Q4H PRN Hiram ComberElizabeth Woodland Mumaw, DO      . oxytocin (PITOCIN) IV BOLUS FROM BAG  500 mL Intravenous Once [facility], DO      . oxytocin (PITOCIN) IV infusion 40 units in LR 1000 mL - Premix  2.5 Units/hr Intravenous Continuous Hiram ComberElizabeth Woodland Mumaw, DO      . oxytocin (PITOCIN) IV infusion 40 units in LR 1000 mL - Premix  1-40 milli-units/min Intravenous Titrated Rhona RaiderJacob J Stinson, DO      . sodium citrate-citric acid (ORACIT) solution 30 mL  30 mL Oral Q2H PRN [facility], DO      . sodium phosphate (FLEET) 7-19 GM/118ML enema 1 enema  1 enema Rectal PRN Hiram ComberElizabeth  Woodland Mumaw, DO      . terbutaline (BRETHINE) injection 0.25 mg  0.25 mg Subcutaneous Once PRN Levie HeritageJacob J Stinson, DO        Allergies: No Known Allergies  Review of Systems:negativenegative  Physical Exam: Blood pressure 124/69, pulse 96, temperature 98.6 F (37 C), temperature source Oral, resp. rate 16, height 5\' 9"  (1.753 m), weight 276 lb 8 oz (125.4 kg), last menstrual period 06/05/2015, unknown if currently breastfeeding. GENERAL: Well-developed, well-nourished female in no acute distress.  LUNGS: Clear to auscultation bilaterally.  HEART: Regular rate and rhythm. ABDOMEN: Soft, nontender, nondistended, gravid. EFW 9 lbs EXTREMITIES: Nontender, no edema, 2+ distal pulses. Cervical Exam:  Dilation: 3.5 Effacement (%): 20 Cervical Position: Posterior Station: -3 Presentation: Vertex Exam by:: stinson  FHT:  Baseline rate 140 bpm   Variability moderate  Accelerations present   Decelerations none Contractions: occasional Pertinent Labs/Studies:  GBS neg  Assessment : Melissa Chan is a 26 y.o. [id number] at 2847w0d being admitted for IOL for GDM A1  Plan: #1 [id number] #2 IUP 5147w0d #3 IOL for GDM A1 #4 GBS neg #5 Rh +  Admit Start pitocin AROM when able Plans to breast feed Desires BTL postpartum No antibiotics seen  Levie HeritageJacob J Stinson, DO 03/11/2016, 7:56 AM

## 2016-03-11 NOTE — Anesthesia Pain Management Evaluation Note (Signed)
  CRNA Pain Management Visit Note  Patient: Melissa Chan, 26 y.o., female  "Hello I am a [facility] of the anesthesia team [facility]. We have an anesthesia team available at all times to provide care throughout the hospital, including epidural management and anesthesia for C-section. I don't know your plan for the delivery whether it a natural birth, water birth, IV sedation, nitrous supplementation, doula or epidural, but we want to meet your pain goals."   1.Was your pain managed to your expectations on prior hospitalizations?   Yes   2.What is your expectation for pain management during this hospitalization?     Labor support without medications and IV pain meds  3.How can we help you reach that goal? Desires natural labor with iv pain meds if needed. Has delivered previous babies with no epidural.  Record the patient's initial score and the patient's pain goal.   Pain: 0  Pain Goal: 10 [facility] wants you to be able to say your pain was always managed very well.  Orville Widmann 03/11/2016

## 2016-03-11 NOTE — Progress Notes (Signed)
Patient ID: Melissa Chan, female   DOB: 1990-04-15, 26 y.o.   MRN: [id number]  S: Patient seen & examined for progress of labor. Patient comfortable, feeling mild cramping after starting pitocin.    O:  Vitals:   03/11/16 0729 03/11/16 0845 03/11/16 0900 03/11/16 0930  BP: 124/69  119/77 111/69  Pulse: 96  92 87  Resp:  20    Temp:      TempSrc:      Weight: 276 lb 8 oz (125.4 kg)     Height: 5\' 9"  (1.753 m)       Dilation: 3.5 Effacement (%): 20 Cervical Position: Posterior Station: -3 Presentation: Vertex Exam by:: stinson   FHT: 140bpm, mod var, +accels, no decels TOCO: None seen   A/P: Continue pitocin Will reevaluate for AROM Continue expectant management Anticipate SVD

## 2016-03-11 NOTE — Progress Notes (Signed)
Patient ID: Melissa Chan, female   DOB: Oct 10, 1990, 26 y.o.   MRN: [id number]  S: Patient seen & examined for progress of labor. Patient comfortable in bed, contracting every 1-2 min. Does not want epidural. Requesting AROM.    O:  Vitals:   03/11/16 1300 03/11/16 1330 03/11/16 1400 03/11/16 1430  BP: 118/71 117/72 127/84 112/73  Pulse: 91 90 88 93  Resp:  18 18   Temp:      TempSrc:      Weight:      Height:        Dilation: 5.5 Effacement (%): Thick Cervical Position: Posterior Station: -3 Presentation: Vertex Exam by:: Dr. Omer JackMumaw   FHT: 140 bpm, mod var, +accels, a couple variable decels during contractions with return to baseline TOCO: q1-762min  AROM performed, clear fluid returned.  A/P: AROM Continue pitocin Continue expectant management Anticipate SVD

## 2016-03-12 ENCOUNTER — Encounter (HOSPITAL_COMMUNITY)
Admission: RE | Disposition: A | Discharge status: Home / Self Care | Payer: Self-pay | Source: Ambulatory Visit | Attending: Family Medicine

## 2016-03-12 ENCOUNTER — Inpatient Hospital Stay (HOSPITAL_COMMUNITY): Payer: Medicaid Other | Admitting: Anesthesiology

## 2016-03-12 ENCOUNTER — Encounter (HOSPITAL_COMMUNITY): Payer: Self-pay

## 2016-03-12 DIAGNOSIS — Z302 Encounter for sterilization: Secondary | ICD-10-CM

## 2016-03-12 HISTORY — PX: TUBAL LIGATION: SHX77

## 2016-03-12 LAB — RPR: RPR: NONREACTIVE

## 2016-03-12 SURGERY — LIGATION, FALLOPIAN TUBE, POSTPARTUM
Anesthesia: Spinal | Site: Abdomen | Laterality: Bilateral

## 2016-03-12 MED ORDER — FENTANYL CITRATE (PF) 100 MCG/2ML IJ SOLN
INTRAMUSCULAR | Status: AC
  Filled 2016-03-12: qty 2

## 2016-03-12 MED ORDER — LACTATED RINGERS IV SOLN: INTRAVENOUS | Status: DC

## 2016-03-12 MED ORDER — PROPOFOL 10 MG/ML IV BOLUS
INTRAVENOUS | Status: AC
  Filled 2016-03-12: qty 20

## 2016-03-12 MED ORDER — FENTANYL CITRATE (PF) 100 MCG/2ML IJ SOLN
INTRAMUSCULAR | Status: DC | PRN
  Administered 2016-03-12 (×2): 50 ug via INTRAVENOUS

## 2016-03-12 MED ORDER — LACTATED RINGERS IV SOLN
INTRAVENOUS | Status: DC | PRN
  Administered 2016-03-12 (×2): via INTRAVENOUS

## 2016-03-12 MED ORDER — MIDAZOLAM HCL 2 MG/2ML IJ SOLN
INTRAMUSCULAR | Status: DC | PRN
  Administered 2016-03-12 (×2): 1 mg via INTRAVENOUS

## 2016-03-12 MED ORDER — ONDANSETRON HCL 4 MG/2ML IJ SOLN
INTRAMUSCULAR | Status: AC
  Filled 2016-03-12: qty 2

## 2016-03-12 MED ORDER — MEPERIDINE HCL 25 MG/ML IJ SOLN: 6.2500 mg | INTRAMUSCULAR | Status: DC | PRN

## 2016-03-12 MED ORDER — FAMOTIDINE 20 MG PO TABS
40.0000 mg | ORAL_TABLET | Freq: Once | ORAL | Status: AC
  Administered 2016-03-12: 40 mg via ORAL
  Filled 2016-03-12: qty 2

## 2016-03-12 MED ORDER — BUPIVACAINE HCL (PF) 0.25 % IJ SOLN
INTRAMUSCULAR | Status: DC | PRN
  Administered 2016-03-12: 19 mL
  Administered 2016-03-12: 6 mL
  Administered 2016-03-12: 3 mL

## 2016-03-12 MED ORDER — KETOROLAC TROMETHAMINE 30 MG/ML IJ SOLN
INTRAMUSCULAR | Status: AC
  Filled 2016-03-12: qty 1

## 2016-03-12 MED ORDER — HYDROMORPHONE HCL 1 MG/ML IJ SOLN: 0.2500 mg | INTRAMUSCULAR | Status: DC | PRN

## 2016-03-12 MED ORDER — FERROUS SULFATE 325 (65 FE) MG PO TABS
325.0000 mg | ORAL_TABLET | Freq: Three times a day (TID) | ORAL | Status: DC
  Administered 2016-03-12 – 2016-03-13 (×2): 325 mg via ORAL
  Filled 2016-03-12 (×2): qty 1

## 2016-03-12 MED ORDER — METOCLOPRAMIDE HCL 10 MG PO TABS
10.0000 mg | ORAL_TABLET | Freq: Once | ORAL | Status: AC
  Administered 2016-03-12: 10 mg via ORAL
  Filled 2016-03-12: qty 1

## 2016-03-12 MED ORDER — PROMETHAZINE HCL 25 MG/ML IJ SOLN: 6.2500 mg | INTRAMUSCULAR | Status: DC | PRN

## 2016-03-12 MED ORDER — MIDAZOLAM HCL 2 MG/2ML IJ SOLN
INTRAMUSCULAR | Status: AC
  Filled 2016-03-12: qty 2

## 2016-03-12 MED ORDER — KETOROLAC TROMETHAMINE 30 MG/ML IJ SOLN
30.0000 mg | Freq: Once | INTRAMUSCULAR | Status: AC
  Administered 2016-03-12: 30 mg via INTRAVENOUS

## 2016-03-12 MED ORDER — BUPIVACAINE HCL (PF) 0.25 % IJ SOLN
INTRAMUSCULAR | Status: AC
  Filled 2016-03-12: qty 30

## 2016-03-12 MED ORDER — BUPIVACAINE IN DEXTROSE 0.75-8.25 % IT SOLN
INTRATHECAL | Status: DC | PRN
  Administered 2016-03-12: 1.4 mL via INTRATHECAL

## 2016-03-12 MED ORDER — LIDOCAINE HCL (CARDIAC) 20 MG/ML IV SOLN
INTRAVENOUS | Status: AC
  Filled 2016-03-12: qty 5

## 2016-03-12 SURGICAL SUPPLY — 20 items
CLIP FILSHIE TUBAL LIGA STRL (Clip) ×3 IMPLANT
CLOTH BEACON ORANGE TIMEOUT ST (SAFETY) ×3 IMPLANT
DRSG OPSITE POSTOP 3X4 (GAUZE/BANDAGES/DRESSINGS) ×3 IMPLANT
DURAPREP 26ML APPLICATOR (WOUND CARE) ×3 IMPLANT
GLOVE BIOGEL PI IND STRL 7.0 (GLOVE) ×2 IMPLANT
GLOVE BIOGEL PI INDICATOR 7.0 (GLOVE) ×4
GLOVE ECLIPSE 7.0 STRL STRAW (GLOVE) ×6 IMPLANT
GOWN STRL REUS W/TWL LRG LVL3 (GOWN DISPOSABLE) ×6 IMPLANT
NEEDLE HYPO 22GX1.5 SAFETY (NEEDLE) ×3 IMPLANT
NS IRRIG 1000ML POUR BTL (IV SOLUTION) ×3 IMPLANT
PACK ABDOMINAL MINOR (CUSTOM PROCEDURE TRAY) ×3 IMPLANT
PROTECTOR NERVE ULNAR (MISCELLANEOUS) ×3 IMPLANT
SPONGE LAP 4X18 X RAY DECT (DISPOSABLE) IMPLANT
SUT VIC AB 0 CT1 27 (SUTURE) ×2
SUT VIC AB 0 CT1 27XBRD ANBCTR (SUTURE) ×1 IMPLANT
SUT VICRYL 4-0 PS2 18IN ABS (SUTURE) ×3 IMPLANT
SYR CONTROL 10ML LL (SYRINGE) ×3 IMPLANT
TOWEL OR 17X24 6PK STRL BLUE (TOWEL DISPOSABLE) ×6 IMPLANT
TRAY FOLEY CATH SILVER 14FR (SET/KITS/TRAYS/PACK) ×3 IMPLANT
WATER STERILE IRR 1000ML POUR (IV SOLUTION) ×3 IMPLANT

## 2016-03-12 NOTE — Progress Notes (Signed)
Post Partum Day #1 Subjective: no complaints, up ad lib, voiding and NPO for BTL later this am.    Objective: Blood pressure (!) 110/56, pulse 77, temperature 98.1 F (36.7 C), resp. rate 18, height 5\' 9"  (1.753 m), weight 276 lb 8 oz (125.4 kg), last menstrual period 06/05/2015, unknown if currently breastfeeding.  Physical Exam:  General: alert, cooperative, appears stated age and no distress Lochia: appropriate Uterine Fundus: firm Incision: none DVT Evaluation: No evidence of DVT seen on physical exam. No cords or calf tenderness. No significant calf/ankle edema.   Recent Labs  03/11/16 0750  HGB 9.8*  HCT 30.5*    Assessment/Plan: Plan for discharge tomorrow, Breastfeeding and Contraception BTL this AM.   LOS: 1 day   Melissa Chan, CNM 03/12/2016, 8:31 AM

## 2016-03-12 NOTE — Anesthesia Procedure Notes (Signed)
Spinal  Patient location during procedure: OR Start time: 03/12/2016 10:45 AM End time: 03/12/2016 10:53 AM Staffing Anesthesiologist: Leilani AbleHATCHETT, Federick Levene Preanesthetic Checklist Completed: patient identified, surgical consent, pre-op evaluation, timeout performed, IV checked, risks and benefits discussed and monitors and equipment checked Spinal Block Patient position: sitting Prep: site prepped and draped and DuraPrep Patient monitoring: heart rate, cardiac monitor, continuous pulse ox and blood pressure Approach: midline Location: L3-4 Injection technique: single-shot Needle Needle type: Sprotte  Needle gauge: 24 G Needle length: 9 cm Needle insertion depth: 9 cm Assessment Sensory level: T8 Additional Notes Needed 17G  Tuohy to 8cm followed by 120mm sprotte to the hub of the Tuohy needle

## 2016-03-12 NOTE — Op Note (Signed)
Melissa Chan  03/12/2016   PREOPERATIVE DIAGNOSIS:  Multiparity, undesired fertility  POSTOPERATIVE DIAGNOSIS:  Multiparity, undesired fertility  PROCEDURE:  Postpartum Bilateral Tubal Sterilization using Filshie Clips   ANESTHESIA:  Epidural and local analgesia using 0.25% Marcaine  COMPLICATIONS:  None immediate.  ESTIMATED BLOOD LOSS: 5 ml.  INDICATIONS: 26 y.o. [id number]  with undesired fertility,status post vaginal delivery, desires permanent sterilization.  Other reversible forms of contraception were discussed with patient; she declines all other modalities. Risks of procedure discussed with patient including but not limited to: risk of regret, permanence of method, bleeding, infection, injury to surrounding organs and need for additional procedures.  Failure risk of 0.5-1% with increased risk of ectopic gestation if pregnancy occurs was also discussed with patient.     FINDINGS:  Normal uterus and tubes.  PROCEDURE DETAILS: The patient was taken to the operating room where her spinal anesthesia was dosed up to surgical level and found to be adequate.  She was then placed in a supine position and prepped and draped in the usual sterile fashion.  After an adequate timeout was performed, attention was turned to the patient's abdomen where a small transverse skin incision was made under the umbilical fold. The incision was taken down to the layer of fascia using the scalpel, and fascia was incised, and extended bilaterally. The peritoneum was entered in a sharp fashion. The patient was placed in Trendelenburg.  A moist lap pad was used to move omentum and bowel away until the left fallopian tube was identified and grasped with a Babcock clamp, and followed out to the fimbriated end.  A Filshie clip was placed on the left fallopian tube about 2 cm from the cornu.  A similar process was carried out on the right side allowing for bilateral tubal sterilization.  Good hemostasis was noted  overall.  Local analgesia was drizzled over both Filshie application sites.The instruments were then removed from the patient's abdomen and the fascial incision was repaired with 0 Vicryl, and the skin was closed with a 4-0 Vicryl subcuticular stitch. The patient tolerated the procedure well.  Sponge, lap, and needle counts were correct times two.  The patient was then taken to the recovery room awake, extubated and in stable condition.  Reva Boresanya S Acen Craun MD 03/12/2016 11:29 AM

## 2016-03-12 NOTE — Interval H&P Note (Signed)
History and Physical Interval Note:  03/12/2016 10:19 AM  Melissa Chan  has presented today for surgery, now s/p SVD and stable, with the diagnosis of undesired fertility  The various methods of treatment have been discussed with the patient and family. After consideration of risks, benefits and other options for treatment, the patient has consented to  Procedure(s): POST PARTUM TUBAL LIGATION (Bilateral) as a surgical intervention .  The patient's history has been reviewed, patient examined, no change in status, stable for surgery.  I have reviewed the patient's chart and labs.  Questions were answered to the patient's satisfaction.  Patient counseled, r.e. Risks benefits of BTL, including permanency of procedure, risk of failure(1:100), increased risk of ectopic.  Patient verbalized understanding and desires to proceed     Reva Boresanya S Zayden Hahne

## 2016-03-12 NOTE — Transfer of Care (Signed)
Immediate Anesthesia Transfer of Care Note  Patient: Melissa Chan  Procedure(s) Performed: Procedure(s): POST PARTUM TUBAL LIGATION (Bilateral)  Patient Location: PACU  Anesthesia Type:Spinal  Level of Consciousness: awake, alert , oriented and patient cooperative  Airway & Oxygen Therapy: Patient Spontanous Breathing  Post-op Assessment: Report given to RN and Post -op Vital signs reviewed and stable  Post vital signs: Reviewed and stable  Last Vitals:  Vitals:   03/12/16 0552 03/12/16 0950  BP: (!) 110/56 (!) 102/59  Pulse: 77 90  Resp: 18 18  Temp: 36.7 C 37 C    Last Pain:  Vitals:   03/12/16 0950  TempSrc: Oral  PainSc:          Complications: No apparent anesthesia complications

## 2016-03-12 NOTE — Progress Notes (Signed)
UR chart review completed.  

## 2016-03-12 NOTE — Addendum Note (Signed)
Addendum  created 03/12/16 2014 by Elbert Ewingsolleen S Laneice Meneely, CRNA   Sign clinical note

## 2016-03-12 NOTE — Anesthesia Preprocedure Evaluation (Signed)
Anesthesia Evaluation  Patient identified by MRN, date of birth, ID band Patient awake    Reviewed: Allergy & Precautions, H&P , NPO status , Patient's Chart, lab work & pertinent test results  Airway Mallampati: II  TM Distance: >3 FB Neck ROM: full    Dental no notable dental hx.    Pulmonary neg pulmonary ROS,    Pulmonary exam normal        Cardiovascular negative cardio ROS Normal cardiovascular exam     Neuro/Psych negative neurological ROS  negative psych ROS   GI/Hepatic negative GI ROS, Neg liver ROS,   Endo/Other  diabetes, GestationalMorbid obesity  Renal/GU negative Renal ROS     Musculoskeletal   Abdominal (+) + obese,   Peds  Hematology negative hematology ROS (+)   Anesthesia Other Findings   Reproductive/Obstetrics (+) Pregnancy                             Anesthesia Physical Anesthesia Plan  ASA: III  Anesthesia Plan: Spinal   Post-op Pain Management:    Induction:   Airway Management Planned:   Additional Equipment:   Intra-op Plan:   Post-operative Plan:   Informed Consent: I have reviewed the patients History and Physical, chart, labs and discussed the procedure including the risks, benefits and alternatives for the proposed anesthesia with the patient or authorized representative who has indicated his/her understanding and acceptance.     Plan Discussed with: CRNA and Surgeon  Anesthesia Plan Comments:         Anesthesia Quick Evaluation

## 2016-03-12 NOTE — Anesthesia Postprocedure Evaluation (Signed)
Anesthesia Post Note  Patient: Melissa Chan  Procedure(s) Performed: Procedure(s) (LRB): POST PARTUM TUBAL LIGATION (Bilateral)  Patient location during evaluation: PACU Anesthesia Type: Spinal Level of consciousness: awake Pain management: pain level controlled Vital Signs Assessment: post-procedure vital signs reviewed and stable Respiratory status: spontaneous breathing Cardiovascular status: stable Postop Assessment: no backache, no headache, spinal receding, patient able to bend at knees and no signs of nausea or vomiting Anesthetic complications: no        Last Vitals:  Vitals:   03/12/16 1245 03/12/16 1300  BP: 114/74 110/73  Pulse: 76 73  Resp: 17 19  Temp:      Last Pain:  Vitals:   03/12/16 1135  TempSrc:   PainSc: 0-No pain   Pain Goal:                 Abia Monaco JR,JOHN Daxten Kovalenko

## 2016-03-12 NOTE — Lactation Note (Signed)
This note was copied from a baby's chart. Lactation Consultation Note  Patient Name: Melissa Chan WUJWJ'XToday's Date: 03/12/2016 Reason for consult: Follow-up assessment Baby at 23 hr of life. Mom desires to offer breast and bottles of formula. Upon entry a visitor was bottle feeding. Mom was eating. She stated she latches the baby then f/u with formula. She declined pumping. She reports bilateral nipple soreness. She does have some light pink hairline cracks on the nipple surfaces. She requested comfort gels. Discussed baby behavior, feeding frequency, supplementing volume guidelines, baby belly size, voids, wt loss, breast changes, and nipple care. Mom is aware of lactation services and support group.    Maternal Data    Feeding Feeding Type: Breast Fed Nipple Type: Slow - flow  LATCH Score/Interventions                      Lactation Tools Discussed/Used     Consult Status Consult Status: Follow-up Date: 03/13/16 Follow-up type: In-patient    [name] 03/12/2016, 4:07 PM

## 2016-03-12 NOTE — Anesthesia Postprocedure Evaluation (Addendum)
Anesthesia Post Note  Patient: Melissa Chan  Procedure(s) Performed: Procedure(s) (LRB): POST PARTUM TUBAL LIGATION (Bilateral)  Patient location during evaluation: Mother Baby Anesthesia Type: Spinal Level of consciousness: awake, awake and alert and oriented Pain management: pain level controlled Vital Signs Assessment: post-procedure vital signs reviewed and stable Respiratory status: spontaneous breathing, nonlabored ventilation and respiratory function stable Cardiovascular status: stable Postop Assessment: no headache, no backache, no signs of nausea or vomiting, patient able to bend at knees and adequate PO intake Anesthetic complications: no        Last Vitals:  Vitals:   03/12/16 1443 03/12/16 1845  BP: 113/63 109/68  Pulse: 83 82  Resp: 20 18  Temp: 36.9 C 36.8 C    Last Pain:  Vitals:   03/12/16 1845  TempSrc: Oral  PainSc: 4    Pain Goal:                 RHYMER,COLLEEN

## 2016-03-13 MED ORDER — OXYCODONE-ACETAMINOPHEN 5-325 MG PO TABS: 1.0000 | ORAL_TABLET | Freq: Four times a day (QID) | ORAL | 0 refills | Status: DC | PRN

## 2016-03-13 MED ORDER — IBUPROFEN 600 MG PO TABS: 600.0000 mg | ORAL_TABLET | Freq: Four times a day (QID) | ORAL | 0 refills | Status: DC

## 2016-03-13 MED ORDER — OXYCODONE HCL 5 MG PO TABS
5.0000 mg | ORAL_TABLET | ORAL | Status: DC | PRN
  Administered 2016-03-13: 5 mg via ORAL
  Filled 2016-03-13: qty 1

## 2016-03-13 NOTE — Lactation Note (Signed)
This note was copied from a baby's chart. Lactation Consultation Note Experienced BF mom BF her now 26 yr old for 5 months. Mom is Breast/formula. Encouraged to BF before formula feeding. Mom stated her milk was starting to come in. Discussed engorgement prevention. Mom has WIC, stating they have good counselors.  Has LC out pt. #'s if needed Patient Name: Melissa Chan JYNWG'NToday's Date: 03/13/2016 Reason for consult: Follow-up assessment   Maternal Data    Feeding    LATCH Score/Interventions                      Lactation Tools Discussed/Used     Consult Status Consult Status: Complete Date: 03/13/16    Charyl DancerCARVER, Gertrude Bucks G 03/13/2016, 2:04 PM

## 2016-03-13 NOTE — Discharge Summary (Signed)
OB Discharge Summary     Patient Name: Melissa Chan DOB: 1990/06/25 MRN: [id number]  Date of admission: 03/11/2016 Delivering MD: Jen Mow [facility]   Date of discharge: 03/13/2016  Admitting diagnosis: 40wks, induction . undesired fertility Intrauterine pregnancy: [redacted]w[redacted]d     Secondary diagnosis:  Active Problems:   Gestational diabetes, diet controlled   Encounter for sterilization  Additional problems:obesity     Discharge diagnosis: Term Pregnancy Delivered and Postop Day #1 s/p BTL                                                                                                Post partum procedures:postpartum tubal ligation  Augmentation: Pitocin  Complications: None  Hospital course:  Induction of Labor With Vaginal Delivery   26 y.o. yo [id number] at [redacted]w[redacted]d was admitted to the hospital 03/11/2016 for induction of labor.  Indication for induction: A1 DM.  Patient had an uncomplicated labor course as follows: Membrane Rupture Time/Date: 2:44 PM ,03/11/2016   Intrapartum Procedures: Episiotomy: None [1]                                         Lacerations:  None [1]  Patient had delivery of a Viable infant.  Information for the patient's newborn:  Melissa, Chan [[id number]]  Delivery Method: Vag-Spont   03/11/2016  Details of delivery can be found in separate delivery note.  Patient had a routine postpartum course. Patient is discharged home 03/13/16.  Physical exam  Vitals:   03/12/16 2241 03/13/16 0245 03/13/16 0556 03/13/16 0642  BP: 97/71 (!) 130/57 123/74 113/66  Pulse: 80 81 75 73  Resp: 18 18 18 18   Temp: 98.6 F (37 C) 97.4 F (36.3 C) 98.2 F (36.8 C) 97.5 F (36.4 C)  TempSrc: Oral Oral Oral Oral  SpO2:      Weight:      Height:       General: alert Lochia: appropriate Uterine Fundus: firm Incision: Healing well with no significant drainage DVT Evaluation: No evidence of DVT seen on physical exam. Labs: Lab Results  Component  Value Date   WBC 7.0 03/11/2016   HGB 9.8 (L) 03/11/2016   HCT 30.5 (L) 03/11/2016   MCV 77.6 (L) 03/11/2016   PLT 246 03/11/2016   No flowsheet data found.  Discharge instruction: per After Visit Summary and "Baby and Me Booklet".  After visit meds:  Allergies as of 03/13/2016   No Known Allergies     Medication List    TAKE these medications   ibuprofen 600 MG tablet Commonly known as:  ADVIL,MOTRIN Take 1 tablet (600 mg total) by mouth every 6 (six) hours.   multivitamin-prenatal 27-0.8 MG Tabs tablet Take 1 tablet by mouth daily at 12 noon.   oxyCODONE-acetaminophen 5-325 MG tablet Commonly known as:  PERCOCET/ROXICET Take 1-2 tablets by mouth every 6 (six) hours as needed.       Diet: carb modified diet  Activity: Advance as tolerated. Pelvic rest for 6  weeks.   Outpatient follow up:6 weeks Follow up Appt:No future appointments. Follow up Visit:No Follow-up on file.  Postpartum contraception: Tubal Ligation  Newborn Data: Live born female  Birth Weight: 8 lb 6.8 oz (3822 g) APGAR: 9, 10  Baby Feeding: Breast Disposition:home with mother   03/13/2016 Melissa BossierMyra C Jujhar Everett, MD

## 2016-03-13 NOTE — Discharge Instructions (Signed)
Home Care Instructions for Mom Introduction  ACTIVITY  Gradually return to your regular activities.  Let yourself rest. Nap while your baby sleeps.  Avoid lifting anything that is heavier than 10 lb (4.5 kg) until your health care provider says it is okay.  Avoid activities that take a lot of effort and energy (are strenuous) until approved by your health care provider. Walking at a slow-to-moderate pace is usually safe.  If you had a cesarean delivery:  Do not vacuum, climb stairs, or drive a car for 4-6 weeks.  Have someone help you at home until you feel like you can do your usual activities yourself.  Do exercises as told by your health care provider, if this applies. VAGINAL BLEEDING You may continue to bleed for 4-6 weeks after delivery. Over time, the amount of blood usually decreases and the color of the blood usually gets lighter. However, the flow of bright red blood may increase if you have been too active. If you need to use more than one pad in an hour because your pad gets soaked, or if you pass a large clot:  Lie down.  Raise your feet.  Place a cold compress on your lower abdomen.  Rest.  Call your health care provider. If you are breastfeeding, your period should return anytime between 8 weeks after delivery and the time that you stop breastfeeding. If you are not breastfeeding, your period should return 6-8 weeks after delivery. PERINEAL CARE The perineal area, or perineum, is the part of your body between your thighs. After delivery, this area needs special care. Follow these instructions as told by your health care provider.  Take warm tub baths for 15-20 minutes.  Use medicated pads and pain-relieving sprays and creams as told.  Do not use tampons or douches until vaginal bleeding has stopped.  Each time you go to the bathroom:  Use a peri bottle.  Change your pad.  Use towelettes in place of toilet paper until your stitches have healed.  Do Kegel  exercises every day. Kegel exercises help to maintain the muscles that support the vagina, bladder, and bowels. You can do these exercises while you are standing, sitting, or lying down. To do Kegel exercises:  Tighten the muscles of your abdomen and the muscles that surround your birth canal.  Hold for a few seconds.  Relax.  Repeat until you have done this 5 times in a row.  To prevent hemorrhoids from developing or getting worse:  Drink enough fluid to keep your urine clear or pale yellow.  Avoid straining when having a bowel movement.  Take over-the-counter medicines and stool softeners as told by your health care provider. BREAST CARE  Wear a tight-fitting bra.  Avoid taking over-the-counter pain medicine for breast discomfort.  Apply ice to the breasts to help with discomfort as needed:  Put ice in a plastic bag.  Place a towel between your skin and the bag.  Leave the ice on for 20 minutes or as told by your health care provider. NUTRITION  Eat a well-balanced diet.  Do not try to lose weight quickly by cutting back on calories.  Take your prenatal vitamins until your postpartum checkup or until your health care provider tells you to stop. POSTPARTUM DEPRESSION You may find yourself crying for no apparent reason and unable to cope with all of the changes that come with having a newborn. This mood is called postpartum depression. Postpartum depression happens because your hormone levels change after  delivery. If you have postpartum depression, get support from your partner, friends, and family. If the depression does not go away on its own after several weeks, contact your health care provider. BREAST SELF-EXAM Do a breast self-exam each month, at the same time of the month. If you are breastfeeding, check your breasts just after a feeding, when your breasts are less full. If you are breastfeeding and your period has started, check your breasts on day 5, 6, or 7 of your  period. Report any lumps, bumps, or discharge to your health care provider. Know that breasts are normally lumpy if you are breastfeeding. This is temporary, and it is not a health risk. INTIMACY AND SEXUALITY Avoid sexual activity for at least 3-4 weeks after delivery or until the brownish-red vaginal flow is completely gone. If you want to avoid pregnancy, use some form of birth control. You can get pregnant after delivery, even if you have not had your period. SEEK MEDICAL CARE IF:  You feel unable to cope with the changes that a child brings to your life, and these feelings do not go away after several weeks.  You notice a lump, a bump, or discharge on your breast. SEEK IMMEDIATE MEDICAL CARE IF:  Blood soaks your pad in 1 hour or less.  You have:  Severe pain or cramping in your lower abdomen.  A bad-smelling vaginal discharge.  A fever that is not controlled by medicine.  A fever, and an area of your breast is red and sore.  Pain or redness in your calf.  Sudden, severe chest pain.  Shortness of breath.  Painful or bloody urination.  Problems with your vision.  You vomit for 12 hours or longer.  You develop a severe headache.  You have serious thoughts about hurting yourself, your child, or anyone else. This information is not intended to replace advice given to you by your health care provider. Make sure you discuss any questions you have with your health care provider. Document Released: 01/09/2000 Document Revised: 06/19/2015 Document Reviewed: 07/15/2014  2017 Elsevier   Iron-Rich Diet  Introduction Iron is a mineral that helps your body to produce hemoglobin. Hemoglobin is a protein in your red blood cells that carries oxygen to your body's tissues. Eating too little iron may cause you to feel weak and tired, and it can increase your risk for infection. Eating enough iron is necessary for your body's metabolism, muscle function, and nervous system. Iron is  naturally found in many foods. It can also be added to foods or fortified in foods. There are two types of dietary iron:  Heme iron. Heme iron is absorbed by the body more easily than nonheme iron. Heme iron is found in meat, poultry, and fish.  Nonheme iron. Nonheme iron is found in dietary supplements, iron-fortified grains, beans, and vegetables. You may need to follow an iron-rich diet if:  You have been diagnosed with iron deficiency or iron-deficiency anemia.  You have a condition that prevents you from absorbing dietary iron, such as:  Infection in your intestines.  Celiac disease. This involves long-lasting (chronic) inflammation of your intestines.  You do not eat enough iron.  You eat a diet that is high in foods that impair iron absorption.  You have lost a lot of blood.  You have heavy bleeding during your menstrual cycle.  You are pregnant. What is my plan? Your health care provider may help you to determine how much iron you need per day based  on your condition. Generally, when a person consumes sufficient amounts of iron in the diet, the following iron needs are met:  Men.  67-71 years old: 11 mg per day.  86-62 years old: 8 mg per day.  Women.  26-12 years old: 15 mg per day.  44-55 years old: 18 mg per day.  Over 33 years old: 8 mg per day.  Pregnant women: 27 mg per day.  Breastfeeding women: 9 mg per day. What do I need to know about an iron-rich diet?  Eat fresh fruits and vegetables that are high in vitamin C along with foods that are high in iron. This will help increase the amount of iron that your body absorbs from food, especially with foods containing nonheme iron. Foods that are high in vitamin C include oranges, peppers, tomatoes, and mango.  Take iron supplements only as directed by your health care provider. Overdose of iron can be life-threatening. If you were prescribed iron supplements, take them with orange juice or a vitamin C  supplement.  Cook foods in pots and pans that are made from iron.  Eat nonheme iron-containing foods alongside foods that are high in heme iron. This helps to improve your iron absorption.  Certain foods and drinks contain compounds that impair iron absorption. Avoid eating these foods in the same meal as iron-rich foods or with iron supplements. These include:  Coffee, black tea, and red wine.  Milk, dairy products, and foods that are high in calcium.  Beans, soybeans, and peas.  Whole grains.  When eating foods that contain both nonheme iron and compounds that impair iron absorption, follow these tips to absorb iron better.  Soak beans overnight before cooking.  Soak whole grains overnight and drain them before using.  Ferment flours before baking, such as using yeast in bread dough. What foods can I eat? Grains  Iron-fortified breakfast cereal. Iron-fortified whole-wheat bread. Enriched rice. Sprouted grains. Vegetables  Spinach. Potatoes with skin. Green peas. Broccoli. Red and green bell peppers. Fermented vegetables. Fruits  Prunes. Raisins. Oranges. Strawberries. Mango. Grapefruit. Meats and Other Protein Sources  Beef liver. Oysters. Beef. Shrimp. [place]. Chicken. Globe. Sardines. Chickpeas. Nuts. Tofu. Beverages  Tomato juice. Fresh orange juice. Prune juice. Hibiscus tea. Fortified instant breakfast shakes. Condiments  Tahini. Fermented soy sauce. Sweets and Desserts  Black-strap molasses. Other  Wheat germ. The items listed above may not be a complete list of recommended foods or beverages. Contact your dietitian for more options.  What foods are not recommended? Grains  Whole grains. Bran cereal. Bran flour. Oats. Vegetables  Artichokes. Brussels sprouts. Kale. Fruits  Blueberries. Raspberries. Strawberries. Figs. Meats and Other Protein Sources  Soybeans. Products made from soy protein. Dairy  Milk. Cream. Cheese. Yogurt. Cottage cheese. Beverages    Coffee. Black tea. Red wine. Sweets and Desserts  Cocoa. Chocolate. Ice cream. Other  Basil. Oregano. Parsley. The items listed above may not be a complete list of foods and beverages to avoid. Contact your dietitian for more information.  This information is not intended to replace advice given to you by your health care provider. Make sure you discuss any questions you have with your health care provider. Document Released: 08/25/2004 Document Revised: 08/01/2015 Document Reviewed: 08/08/2013  2017 Elsevier

## 2016-03-14 ENCOUNTER — Encounter (HOSPITAL_COMMUNITY): Payer: Self-pay | Admitting: Family Medicine

## 2016-05-04 ENCOUNTER — Ambulatory Visit: Payer: Medicaid Other | Admitting: Obstetrics

## 2016-05-31 ENCOUNTER — Other Ambulatory Visit (HOSPITAL_COMMUNITY)
Admission: RE | Admit: 2016-05-31 | Discharge: 2016-05-31 | Disposition: A | Discharge status: Home / Self Care | Payer: Medicaid Other | Source: Ambulatory Visit | Attending: Obstetrics | Admitting: Obstetrics

## 2016-05-31 ENCOUNTER — Encounter: Payer: Self-pay | Admitting: Obstetrics

## 2016-05-31 ENCOUNTER — Ambulatory Visit (INDEPENDENT_AMBULATORY_CARE_PROVIDER_SITE_OTHER): Payer: Medicaid Other | Admitting: Obstetrics

## 2016-05-31 VITALS — BP 131/98 | HR 89 | Ht 69.0 in | Wt 278.6 lb

## 2016-05-31 DIAGNOSIS — N898 Other specified noninflammatory disorders of vagina: Secondary | ICD-10-CM

## 2016-05-31 DIAGNOSIS — Z0000 Encounter for general adult medical examination without abnormal findings: Secondary | ICD-10-CM | POA: Diagnosis not present

## 2016-05-31 DIAGNOSIS — Z01419 Encounter for gynecological examination (general) (routine) without abnormal findings: Secondary | ICD-10-CM | POA: Diagnosis not present

## 2016-06-01 LAB — CYTOLOGY - PAP: DIAGNOSIS: NEGATIVE

## 2016-06-02 LAB — CERVICOVAGINAL ANCILLARY ONLY
Bacterial vaginitis: NEGATIVE
Candida vaginitis: POSITIVE — AB
Chlamydia: NEGATIVE
NEISSERIA GONORRHEA: NEGATIVE
TRICH (WINDOWPATH): NEGATIVE

## 2016-06-03 ENCOUNTER — Other Ambulatory Visit: Payer: Self-pay | Admitting: *Deleted

## 2016-06-03 ENCOUNTER — Other Ambulatory Visit: Payer: Self-pay | Admitting: Obstetrics

## 2016-06-03 DIAGNOSIS — B379 Candidiasis, unspecified: Secondary | ICD-10-CM

## 2016-06-03 DIAGNOSIS — B9689 Other specified bacterial agents as the cause of diseases classified elsewhere: Secondary | ICD-10-CM

## 2016-06-03 DIAGNOSIS — N760 Acute vaginitis: Principal | ICD-10-CM

## 2016-06-03 MED ORDER — METRONIDAZOLE 500 MG PO TABS: 500.0000 mg | ORAL_TABLET | Freq: Two times a day (BID) | ORAL | 2 refills | Status: DC

## 2016-06-03 MED ORDER — FLUCONAZOLE 150 MG PO TABS: 150.0000 mg | ORAL_TABLET | Freq: Once | ORAL | 0 refills | Status: DC

## 2016-06-03 NOTE — Progress Notes (Signed)
Diflucan sent to pharmacy for +yeast infection per Dr Clearance CootsHarper.

## 2016-06-03 NOTE — Progress Notes (Signed)
Subjective:        Melissa Chan is a 26 y.o. female here for a routine exam.  Current complaints: None.    Personal health questionnaire:  Is patient Ashkenazi Jewish, have a family history of breast and/or ovarian cancer: no Is there a family history of uterine cancer diagnosed at age < 64, gastrointestinal cancer, urinary tract cancer, family member who is a Personnel officer syndrome-associated carrier: no Is the patient overweight and hypertensive, family history of diabetes, personal history of gestational diabetes, preeclampsia or PCOS: no Is patient over 6, have PCOS,  family history of premature CHD under age 49, diabetes, smoke, have hypertension or peripheral artery disease:  no At any time, has a partner hit, kicked or otherwise hurt or frightened you?: no Over the past 2 weeks, have you felt down, depressed or hopeless?: no Over the past 2 weeks, have you felt little interest or pleasure in doing things?:no   Gynecologic History Patient's last menstrual period was 05/27/2016. Contraception: tubal ligation Last Pap: 2017. Results were: normal Last mammogram: n/a. Results were: n/a  Obstetric History OB History  Gravida Para Term Preterm AB Living  5 5 4 1   5   SAB TAB Ectopic Multiple Live Births        0 5    # Outcome Date GA Lbr Len/2nd Weight Sex Delivery Anes PTL Lv  5 Term 03/11/16 [redacted]w[redacted]d 01:29 8 lb 6.8 oz (3.822 kg) F Vag-Spont None  LIV  4 Term 01/14/14 [redacted]w[redacted]d 10:47 / 00:05 7 lb 8.8 oz (3.425 kg) F Vag-Spont None  LIV     Birth Comments: WNL  3 Term 03/17/12 [redacted]w[redacted]d  6 lb 13 oz (3.09 kg) M    LIV  2 Term 04/04/06 [redacted]w[redacted]d  8 lb 14 oz (4.026 kg) M Vag-Spont EPI  LIV  1 Preterm 02/27/05 [redacted]w[redacted]d  3 lb 14 oz (1.758 kg) F    LIV      Past Medical History:  Diagnosis Date  . Medical history non-contributory     Past Surgical History:  Procedure Laterality Date  . NO PAST SURGERIES    . TUBAL LIGATION Bilateral 03/12/2016   Procedure: POST PARTUM TUBAL LIGATION;   Surgeon: Reva Bores, MD;  Location: [facility];  Service: Gynecology;  Laterality: Bilateral;    No current outpatient prescriptions on file. No Known Allergies  Social History  Substance Use Topics  . Smoking status: Never Smoker  . Smokeless tobacco: Never Used  . Alcohol use No    Family History  Problem Relation Age of Onset  . Hypertension Mother   . Hypertension Father   . Diabetes Father   . Cancer Maternal Grandmother   . Heart disease Paternal Grandmother       Review of Systems  Constitutional: negative for fatigue and weight loss Respiratory: negative for cough and wheezing Cardiovascular: negative for chest pain, fatigue and palpitations Gastrointestinal: negative for abdominal pain and change in bowel habits Musculoskeletal:negative for myalgias Neurological: negative for gait problems and tremors Behavioral/Psych: negative for abusive relationship, depression Endocrine: negative for temperature intolerance    Genitourinary:negative for abnormal menstrual periods, genital lesions, hot flashes, sexual problems and vaginal discharge Integument/breast: negative for breast lump, breast tenderness, nipple discharge and skin lesion(s)    Objective:       BP (!) 131/98   Pulse 89   Ht 5\' 9"  (1.753 m)   Wt 278 lb 9.6 oz (126.4 kg)   LMP 05/27/2016   Breastfeeding?  No   BMI 41.14 kg/m  General:   alert  Skin:   no rash or abnormalities  Lungs:   clear to auscultation bilaterally  Heart:   regular rate and rhythm, S1, S2 normal, no murmur, click, rub or gallop  Breasts:   normal without suspicious masses, skin or nipple changes or axillary nodes  Abdomen:  normal findings: no organomegaly, soft, non-tender and no hernia  Pelvis:  External genitalia: normal general appearance Urinary system: urethral meatus normal and bladder without fullness, nontender Vaginal: normal without tenderness, induration or masses Cervix: normal appearance Adnexa: normal bimanual  exam Uterus: anteverted and non-tender, normal size   Lab Review Urine pregnancy test Labs reviewed yes Radiologic studies reviewed no  50% of 20 min visit spent on counseling and coordination of care.    Assessment:    Healthy female exam.   Encounter for routine gynecological examination with Papanicolaou smear of cervix - Plan: Cytology - PAP  Vaginal discharge - Plan: Cervicovaginal ancillary only   Plan:    Education reviewed: calcium supplements, depression evaluation, low fat, low cholesterol diet, safe sex/STD prevention, self breast exams and weight bearing exercise. Contraception: tubal ligation. Follow up in: 1 year.   No orders of the defined types were placed in this encounter.  No orders of the defined types were placed in this encounter.

## 2016-06-08 ENCOUNTER — Other Ambulatory Visit: Payer: Self-pay | Admitting: *Deleted

## 2016-06-08 MED ORDER — FLUCONAZOLE 150 MG PO TABS: 150.0000 mg | ORAL_TABLET | Freq: Once | ORAL | 0 refills | Status: AC

## 2016-06-08 NOTE — Progress Notes (Signed)
Rx for Diflucan was to be sent last week. When ordered it was as print Rx due to no pharmacy listed. Diflucan was reordered today in order for pt to fill.

## 2016-06-14 ENCOUNTER — Telehealth: Payer: Self-pay

## 2016-06-14 MED ORDER — METRONIDAZOLE 500 MG PO TABS: 500.0000 mg | ORAL_TABLET | Freq: Two times a day (BID) | ORAL | 0 refills | Status: AC

## 2016-06-14 NOTE — Telephone Encounter (Signed)
Pt complains of having a white, runny discharge with fishy odor after being treated for a yeast infection. Per protocol Flagyl sent to pharmacy. Pt instructed to follow up if sx does not improve

## 2016-07-02 NOTE — Addendum Note (Signed)
Addendum  created 07/02/16 1017 by Vennela Jutte, MD   Sign clinical note    

## 2017-02-23 ENCOUNTER — Encounter: Payer: Self-pay | Admitting: Obstetrics

## 2017-02-23 ENCOUNTER — Other Ambulatory Visit (HOSPITAL_COMMUNITY)
Admission: RE | Admit: 2017-02-23 | Discharge: 2017-02-23 | Disposition: A | Discharge status: Home / Self Care | Payer: Medicaid Other | Source: Ambulatory Visit | Attending: Obstetrics | Admitting: Obstetrics

## 2017-02-23 ENCOUNTER — Ambulatory Visit: Payer: Medicaid Other | Admitting: Obstetrics

## 2017-02-23 VITALS — BP 125/84 | HR 91 | Wt 295.0 lb

## 2017-02-23 DIAGNOSIS — Z3202 Encounter for pregnancy test, result negative: Secondary | ICD-10-CM

## 2017-02-23 DIAGNOSIS — N946 Dysmenorrhea, unspecified: Secondary | ICD-10-CM

## 2017-02-23 DIAGNOSIS — Z6841 Body Mass Index (BMI) 40.0 and over, adult: Secondary | ICD-10-CM | POA: Diagnosis not present

## 2017-02-23 DIAGNOSIS — B9689 Other specified bacterial agents as the cause of diseases classified elsewhere: Secondary | ICD-10-CM | POA: Insufficient documentation

## 2017-02-23 DIAGNOSIS — N939 Abnormal uterine and vaginal bleeding, unspecified: Secondary | ICD-10-CM

## 2017-02-23 DIAGNOSIS — N760 Acute vaginitis: Secondary | ICD-10-CM | POA: Diagnosis not present

## 2017-02-23 LAB — POCT URINE PREGNANCY: Preg Test, Ur: NEGATIVE

## 2017-02-23 MED ORDER — IBUPROFEN 800 MG PO TABS: 800.0000 mg | ORAL_TABLET | Freq: Three times a day (TID) | ORAL | 11 refills | Status: DC | PRN

## 2017-02-23 NOTE — Progress Notes (Signed)
Patient ID: Melissa Chan, female   DOB: 18-Oct-1990, 27 y.o.   MRN: [id number]  Chief Complaint  Patient presents with  . Menstrual Problem    HPI Melissa Chan is a 27 y.o. female.  Heavy 3-4 day periods, with clots.  Borderline anemia.  Had postpartum tubal ligation after last delivery. HPI  Past Medical History:  Diagnosis Date  . Medical history non-contributory     Past Surgical History:  Procedure Laterality Date  . NO PAST SURGERIES    . TUBAL LIGATION Bilateral 03/12/2016   Procedure: POST PARTUM TUBAL LIGATION;  Surgeon: Reva Boresanya S Pratt, MD;  Location: [facility];  Service: Gynecology;  Laterality: Bilateral;    Family History  Problem Relation Age of Onset  . Hypertension Mother   . Hypertension Father   . Diabetes Father   . Cancer Maternal Grandmother   . Heart disease Paternal Grandmother     Social History Social History   Tobacco Use  . Smoking status: Never Smoker  . Smokeless tobacco: Never Used  Substance Use Topics  . Alcohol use: No  . Drug use: No    No Known Allergies  Current Outpatient Medications  Medication Sig Dispense Refill  . metroNIDAZOLE (FLAGYL) 500 MG tablet Take 1 tablet (500 mg total) by mouth 2 (two) times daily. 14 tablet 2   No current facility-administered medications for this visit.     Review of Systems Review of Systems Constitutional: negative for fatigue and weight loss Respiratory: negative for cough and wheezing Cardiovascular: negative for chest pain, fatigue and palpitations Gastrointestinal: negative for abdominal pain and change in bowel habits Genitourinary:positive for heavy periods. Integument/breast: negative for nipple discharge Musculoskeletal:negative for myalgias Neurological: negative for gait problems and tremors Behavioral/Psych: negative for abusive relationship, depression Endocrine: negative for temperature intolerance      Blood pressure 125/84, pulse 91, weight 295 lb (133.8 kg), last  menstrual period 02/21/2017, not currently breastfeeding.  Physical Exam Physical Exam           General:  Alert and no distress Abdomen:  normal findings: no organomegaly, soft, non-tender and no hernia  Pelvis:  External genitalia: normal general appearance Urinary system: urethral meatus normal and bladder without fullness, nontender Vaginal: normal without tenderness, induration or masses Cervix: normal appearance Adnexa: normal bimanual exam Uterus: anteverted and non-tender, normal size    50% of 15 min visit spent on counseling and coordination of care.   Data Reviewed Labs  Assessment     1. Abnormal uterine bleeding (AUB) Rx: - CBC - ibuprofen (ADVIL,MOTRIN) 800 MG tablet; Take 1 tablet (800 mg total) by mouth every 8 (eight) hours as needed.  2. Dysmenorrhea Rx: - POCT urine pregnancy - ibuprofen (ADVIL,MOTRIN) 800 MG tablet; Take 1 tablet (800 mg total) by mouth every 8 (eight) hours as needed.  3. Class 3 severe obesity due to excess calories without serious comorbidity with body mass index (BMI) of 40.0 to 44.9 in adult [facility](HCC) - recommended program of caloric reduction, exercise and behavioral modification Rx: - Hemoglobin A1c - TSH   Plan    Follow up in 4 months  Orders Placed This Encounter  Procedures  . CBC  . Hemoglobin A1c  . TSH  . POCT urine pregnancy   No orders of the defined types were placed in this encounter.    Brock BadHARLES A. Lunden Mcleish MD

## 2017-02-23 NOTE — Addendum Note (Signed)
Addended by: Tim LairLARK, Torra Pala on: 02/23/2017 11:43 AM   Modules accepted: Orders

## 2017-02-23 NOTE — Progress Notes (Signed)
RGYN patient presents for problem visit today. CC: Heavy periods associated w/ clots., changing 6 overnight pads a day. For comfort measure pt takes IBP 800mg  every 6-8hrs. Pt has had a BTL. LMP:02/21/2017 : still on cycle now.  WUJ:[id number]:NEGATIVE

## 2017-02-24 ENCOUNTER — Other Ambulatory Visit: Payer: Self-pay | Admitting: Obstetrics

## 2017-02-24 DIAGNOSIS — E139 Other specified diabetes mellitus without complications: Secondary | ICD-10-CM

## 2017-02-24 DIAGNOSIS — Z0000 Encounter for general adult medical examination without abnormal findings: Secondary | ICD-10-CM

## 2017-02-24 LAB — CBC
HEMATOCRIT: 36.6 % (ref 34.0–46.6)
HEMOGLOBIN: 11.6 g/dL (ref 11.1–15.9)
MCH: 26 pg — ABNORMAL LOW (ref 26.6–33.0)
MCHC: 31.7 g/dL (ref 31.5–35.7)
MCV: 82 fL (ref 79–97)
Platelets: 325 10*3/uL (ref 150–379)
RBC: 4.47 x10E6/uL (ref 3.77–5.28)
RDW: 14.3 % (ref 12.3–15.4)
WBC: 5.4 10*3/uL (ref 3.4–10.8)

## 2017-02-24 LAB — CERVICOVAGINAL ANCILLARY ONLY
Bacterial vaginitis: POSITIVE — AB
CHLAMYDIA, DNA PROBE: NEGATIVE
Candida vaginitis: NEGATIVE
NEISSERIA GONORRHEA: NEGATIVE
Trichomonas: NEGATIVE

## 2017-02-24 LAB — HEMOGLOBIN A1C
ESTIMATED AVERAGE GLUCOSE: 171 mg/dL
HEMOGLOBIN A1C: 7.6 % — AB (ref 4.8–5.6)

## 2017-02-24 LAB — TSH: TSH: 0.85 u[IU]/mL (ref 0.450–4.500)

## 2017-02-24 MED ORDER — METFORMIN HCL ER 500 MG PO TB24: 1000.0000 mg | ORAL_TABLET | Freq: Two times a day (BID) | ORAL | 11 refills | Status: DC

## 2017-02-25 ENCOUNTER — Other Ambulatory Visit: Payer: Self-pay | Admitting: Obstetrics

## 2017-02-25 DIAGNOSIS — N760 Acute vaginitis: Principal | ICD-10-CM

## 2017-02-25 DIAGNOSIS — B9689 Other specified bacterial agents as the cause of diseases classified elsewhere: Secondary | ICD-10-CM

## 2017-02-25 MED ORDER — SECNIDAZOLE 2 G PO PACK: 1.0000 | PACK | Freq: Once | ORAL | 2 refills | Status: AC

## 2017-03-13 ENCOUNTER — Other Ambulatory Visit: Payer: Self-pay

## 2017-03-13 ENCOUNTER — Encounter (HOSPITAL_COMMUNITY): Payer: Self-pay | Admitting: *Deleted

## 2017-03-13 ENCOUNTER — Ambulatory Visit (HOSPITAL_COMMUNITY)
Admission: EM | Admit: 2017-03-13 | Discharge: 2017-03-13 | Disposition: A | Discharge status: Home / Self Care | Payer: Medicaid Other | Attending: Internal Medicine | Admitting: Internal Medicine

## 2017-03-13 DIAGNOSIS — J020 Streptococcal pharyngitis: Secondary | ICD-10-CM

## 2017-03-13 LAB — POCT RAPID STREP A: Streptococcus, Group A Screen (Direct): POSITIVE — AB

## 2017-03-13 MED ORDER — ACETAMINOPHEN 325 MG PO TABS
ORAL_TABLET | ORAL | Status: AC
  Filled 2017-03-13: qty 2

## 2017-03-13 MED ORDER — ACETAMINOPHEN 325 MG PO TABS: 650.0000 mg | ORAL_TABLET | Freq: Once | ORAL | Status: DC

## 2017-03-13 MED ORDER — LIDOCAINE VISCOUS 2 % MT SOLN: 5.0000 mL | OROMUCOSAL | 0 refills | Status: DC | PRN

## 2017-03-13 MED ORDER — AMOXICILLIN 500 MG PO CAPS: 1000.0000 mg | ORAL_CAPSULE | Freq: Two times a day (BID) | ORAL | 0 refills | Status: DC

## 2017-03-13 NOTE — ED Provider Notes (Signed)
03/13/2017 2:42 PM   DOB: [birth year] / MRN: [id number]  SUBJECTIVE:  Melissa Chan is a 27 y.o. female presenting for sore throat.  She associates tactile fever. This started yesterday.  She denies cough, nasal congestion.  She has no allergies to antibiotics.  She feels she is getting worse.  She has not tried any medication yet.   She has No Known Allergies.   She  has a past medical history of Medical history non-contributory.    She  reports that  has never smoked. she has never used smokeless tobacco. She reports that she does not drink alcohol or use drugs. She  reports that she currently engages in sexual activity. She reports using the following methods of birth control/protection: None and Surgical. The patient  has a past surgical history that includes No past surgeries and Tubal ligation (Bilateral, 03/12/2016).  Her family history includes Cancer in her maternal grandmother; Diabetes in her father; Heart disease in her paternal grandmother; Hypertension in her father and mother.  Review of Systems  Constitutional: Negative for chills, diaphoresis and fever.  HENT: Negative for sinus pain.   Eyes: Negative.   Respiratory: Negative for shortness of breath.   Cardiovascular: Negative for chest pain, orthopnea and leg swelling.  Gastrointestinal: Negative for abdominal pain, blood in stool, constipation, diarrhea, heartburn, melena, nausea and vomiting.  Genitourinary: Negative for flank pain.  Skin: Negative for rash.  Neurological: Negative for dizziness, sensory change, speech change, focal weakness and headaches.    OBJECTIVE:  BP 127/76 (BP Location: Left Arm)   Pulse (!) 115   Temp (!) 100.7 F (38.2 C) (Oral)   LMP 02/21/2017 (Exact Date)   SpO2 100%   Physical Exam  Constitutional: She is active.  Non-toxic appearance.  HENT:  Mouth/Throat: Uvula is midline. Posterior oropharyngeal erythema present. No oropharyngeal exudate, posterior oropharyngeal edema or  tonsillar abscesses.  Cardiovascular: Normal rate, regular rhythm, S1 normal, S2 normal, normal heart sounds and intact distal pulses. Exam reveals no gallop, no friction rub and no decreased pulses.  No murmur heard. Pulmonary/Chest: Effort normal. No stridor. No tachypnea. No respiratory distress. She has no wheezes. She has no rales.  Abdominal: She exhibits no distension.  Musculoskeletal: She exhibits no edema.  Lymphadenopathy:       Head (right side): Tonsillar adenopathy present.       Head (left side): No tonsillar adenopathy present.  Neurological: She is alert.  Skin: Skin is warm and dry. She is not diaphoretic. No pallor.    Results for orders placed or performed during the hospital encounter of 03/13/17 (from the past 72 hour(s))  POCT rapid strep A [facility])     Status: Abnormal   Collection Time: 03/13/17  2:35 PM  Result Value Ref Range   Streptococcus, Group A Screen (Direct) POSITIVE (A) NEGATIVE    No results found.  ASSESSMENT AND PLAN:  Orders Placed This Encounter  Procedures  . POCT rapid strep A [facility])    Standing Status:   Standing    Number of Occurrences:   1      Streptococcal sore throat - Positive rapid in the office.  HPI and exam consistent with streptococcal sore throat.      The patient is advised to call or return to clinic if she does not see an improvement in symptoms, or to seek the care of the closest emergency department if she worsens with the above plan.   Deliah BostonMichael Illyanna Petillo, MHS,  PA-C 03/13/2017 2:42 PM   Melissa Neas, PA-C 03/13/17 1444

## 2017-03-13 NOTE — Discharge Instructions (Signed)
Use the viscous lidocaine for acute pain.  Start the amoxicillin as quickly as possible.  Try not to miss doses and complete the course.

## 2017-03-13 NOTE — ED Triage Notes (Signed)
Cough, sore throat, body aches, fever

## 2017-05-02 ENCOUNTER — Telehealth: Payer: Medicaid Other | Admitting: Family

## 2017-05-02 DIAGNOSIS — B373 Candidiasis of vulva and vagina: Secondary | ICD-10-CM

## 2017-05-02 DIAGNOSIS — B3731 Acute candidiasis of vulva and vagina: Secondary | ICD-10-CM

## 2017-05-02 MED ORDER — FLUCONAZOLE 150 MG PO TABS: 150.0000 mg | ORAL_TABLET | ORAL | 0 refills | Status: DC | PRN

## 2017-05-02 NOTE — Progress Notes (Signed)

## 2017-11-09 ENCOUNTER — Encounter (HOSPITAL_COMMUNITY): Payer: Self-pay

## 2017-11-09 ENCOUNTER — Ambulatory Visit (HOSPITAL_COMMUNITY)
Admission: EM | Admit: 2017-11-09 | Discharge: 2017-11-09 | Disposition: A | Discharge status: Home / Self Care | Payer: Medicaid Other | Attending: Family Medicine | Admitting: Family Medicine

## 2017-11-09 DIAGNOSIS — M545 Low back pain, unspecified: Secondary | ICD-10-CM

## 2017-11-09 MED ORDER — CYCLOBENZAPRINE HCL 10 MG PO TABS: 10.0000 mg | ORAL_TABLET | Freq: Three times a day (TID) | ORAL | 0 refills | Status: DC

## 2017-11-09 MED ORDER — TRAMADOL HCL 50 MG PO TABS: 50.0000 mg | ORAL_TABLET | Freq: Four times a day (QID) | ORAL | 0 refills | Status: DC | PRN

## 2017-11-09 MED ORDER — DICLOFENAC SODIUM 75 MG PO TBEC: 75.0000 mg | DELAYED_RELEASE_TABLET | Freq: Two times a day (BID) | ORAL | 0 refills | Status: DC

## 2017-11-09 NOTE — ED Triage Notes (Signed)
Pt presents with back pain after starting to work out.

## 2017-11-09 NOTE — Discharge Instructions (Addendum)
Be aware, pain medications may cause drowsiness. Please do not drive, operate heavy machinery or make important decisions while on this medication, it can cloud your judgement. ° °HOME CARE INSTRUCTIONS: For many people, back pain returns. Since low back pain is rarely dangerous, it is often a condition that people can learn to manage on their own. Please remain active. It is stressful on the back to sit or stand in one place. Do not sit, drive, or stand in one place for more than 30 minutes at a time. Take short walks on level surfaces as soon as pain allows. Try to increase the length of time you walk each day. Do not stay in bed. Resting more than 1 or 2 days can delay your recovery. Do not avoid exercise or work. Your body is made to move. It is not dangerous to be active, even though your back may hurt. Your back will likely heal faster if you return to being active before your pain is gone. Over-the-counter medicines to reduce pain and inflammation are often the most helpful. ° °SEEK MEDICAL CARE IF: °You have pain that is not relieved with rest or medicine. °You have pain that does not improve in 1 week. °You have new symptoms. °You are generally not feeling well. ° °SEEK IMMEDIATE MEDICAL CARE IF: °You have pain that radiates from your back into your legs. °You develop new bowel or bladder control problems. °You have unusual weakness or numbness in your arms or legs. °You develop nausea or vomiting. °You develop abdominal pain. °You feel faint. °

## 2017-11-09 NOTE — ED Provider Notes (Signed)
[facility]   [id number] 11/09/17 Arrival Time: 0904  ASSESSMENT & PLAN:  1. Acute bilateral low back pain without sciatica   Non-traumatic. Likely all muscular in nature. Neurologically normal exam. No imaging needed.  Meds ordered this encounter  Medications  . cyclobenzaprine (FLEXERIL) 10 MG tablet    Sig: Take 1 tablet (10 mg total) by mouth 3 (three) times daily.    Dispense:  20 tablet    Refill:  0  . diclofenac (VOLTAREN) 75 MG EC tablet    Sig: Take 1 tablet (75 mg total) by mouth 2 (two) times daily.    Dispense:  14 tablet    Refill:  0  . traMADol (ULTRAM) 50 MG tablet    Sig: Take 1 tablet (50 mg total) by mouth every 6 (six) hours as needed.    Dispense:  10 tablet    Refill:  0   Follow-up Information    [facility]   Specialty:  Urgent Care Why:  If not improving over the next one week. Contact information: [address] [place] [address] [phone]         Encouraged mobility as she tolerates. Work note given. Medication sedation precautions.  Reviewed expectations re: course of current medical issues. Questions answered. Outlined signs and symptoms indicating need for more acute intervention. Patient verbalized understanding. After Visit Summary given.  SUBJECTIVE: History from: patient. Melissa Chan is a 27 y.o. female who reports fairly persistent mild to moderate pain of her bilateral lower back (L>R); described as aching and stiffness without radiation. Injury/trama: no, but working out more strenuously the past 2-3 months. Onset: gradual, several days ago. Symptoms have gradually worsened since beginning. Relieved by: rest. Worsened by: movements/ambulation. Associated symptoms: none reported. Extremity sensation changes or weakness: none. Self treatment: tried OTCs without relief of pain. History of similar: no. Normal bowel/bladder habits.  Past Surgical  History:  Procedure Laterality Date  . NO PAST SURGERIES    . TUBAL LIGATION Bilateral 03/12/2016   Procedure: POST PARTUM TUBAL LIGATION;  Surgeon: Reva Bores, MD;  Location: [facility];  Service: Gynecology;  Laterality: Bilateral;    ROS: As per HPI. All other systems negative.  OBJECTIVE:  Vitals:   11/09/17 0929  BP: 114/74  Pulse: 83  Resp: 20  Temp: 97.8 F (36.6 C)  TempSrc: Oral  SpO2: 100%    General appearance: alert; no distress Neck: supple; FROM Extremities: warm and well perfused; without gross deformities Resp: CTAB; unlabored respirations CV: brisk extremity capillary refill Back: generalized tenderness over lumbar musculature (L>R); no midline tenderness; no bruising; normal ROM at waist with reported discomfort Skin: warm and dry Neurologic: normal gait but slow; normal symmetric reflexes in all extremities; normal sensation in all extremities Psychological: alert and cooperative; normal mood and affect  No Known Allergies  Past Medical History:  Diagnosis Date  . Medical history non-contributory    Social History   Socioeconomic History  . Marital status: Legally Separated    Spouse name: Not on file  . Number of children: Not on file  . Years of education: Not on file  . Highest education level: Not on file  Occupational History  . Not on file  Social Needs  . Financial resource strain: Not on file  . Food insecurity:    Worry: Not on file    Inability: Not on file  . Transportation needs:    Medical: Not on  file    Non-medical: Not on file  Tobacco Use  . Smoking status: Never Smoker  . Smokeless tobacco: Never Used  Substance and Sexual Activity  . Alcohol use: No  . Drug use: No  . Sexual activity: Yes    Birth control/protection: None, Surgical  Lifestyle  . Physical activity:    Days per week: Not on file    Minutes per session: Not on file  . Stress: Not on file  Relationships  . Social connections:    Talks on phone: Not  on file    Gets together: Not on file    Attends religious service: Not on file    Active member of club or organization: Not on file    Attends meetings of clubs or organizations: Not on file    Relationship status: Not on file  Other Topics Concern  . Not on file  Social History Narrative  . Not on file   Family History  Problem Relation Age of Onset  . Hypertension Mother   . Hypertension Father   . Diabetes Father   . Cancer Maternal Grandmother   . Heart disease Paternal Grandmother    Past Surgical History:  Procedure Laterality Date  . NO PAST SURGERIES    . TUBAL LIGATION Bilateral 03/12/2016   Procedure: POST PARTUM TUBAL LIGATION;  Surgeon: Reva Bores, MD;  Location: [facility];  Service: Gynecology;  Laterality: Bilateral;      Mardella Layman, MD 11/09/17 1040

## 2018-04-15 ENCOUNTER — Telehealth: Payer: BC Managed Care – PPO | Admitting: Family

## 2018-04-15 ENCOUNTER — Telehealth: Payer: Medicaid Other | Admitting: Family

## 2018-04-15 DIAGNOSIS — B373 Candidiasis of vulva and vagina: Secondary | ICD-10-CM

## 2018-04-15 DIAGNOSIS — R0981 Nasal congestion: Secondary | ICD-10-CM

## 2018-04-15 DIAGNOSIS — B3731 Acute candidiasis of vulva and vagina: Secondary | ICD-10-CM

## 2018-04-15 MED ORDER — FLUTICASONE PROPIONATE 50 MCG/ACT NA SUSP: 2.0000 | Freq: Every day | NASAL | 6 refills | Status: DC

## 2018-04-15 MED ORDER — FLUCONAZOLE 150 MG PO TABS: 150.0000 mg | ORAL_TABLET | Freq: Once | ORAL | 0 refills | Status: AC

## 2018-04-15 NOTE — Progress Notes (Signed)
Greater than 5 minutes, yet less than 10 minutes of time have been spent researching, coordinating, and implementing care for this patient today.  Thank you for the details you included in the comment boxes. Those details are very helpful in determining the best course of treatment for you and help us to provide the best care.  E visit for Allergic Rhinitis We are sorry that you are not feeling well.  Here is how we plan to help!  Based on what you have shared with me it looks like you have Allergic Rhinitis.  Rhinitis is when a reaction occurs that causes nasal congestion, runny nose, sneezing, and itching.  Most types of rhinitis are caused by an inflammation and are associated with symptoms in the eyes ears or throat. There are several types of rhinitis.  The most common are acute rhinitis, which is usually caused by a viral illness, allergic or seasonal rhinitis, and nonallergic or year-round rhinitis.  Nasal allergies occur certain times of the year.  Allergic rhinitis is caused when allergens in the air trigger the release of histamine in the body.  Histamine causes itching, swelling, and fluid to build up in the fragile linings of the nasal passages, sinuses and eyelids.  An itchy nose and clear discharge are common.  I recommend the following over the counter treatments: You should take a daily dose of antihistamine and Xyzal 5 mg take 1 tablet daily  I also would recommend a nasal spray: Flonase 2 sprays into each nostril once daily  You may also benefit from eye drops such as: Visine 1-2 drops each eye twice daily as needed  HOME CARE:   You can use an over-the-counter saline nasal spray as needed  Avoid areas where there is heavy dust, mites, or molds  Stay indoors on windy days during the pollen season  Keep windows closed in home, at least in bedroom; use air conditioner.  Use high-efficiency house air filter  Keep windows closed in car, turn AC on re-circulate  Avoid  playing out with dog during pollen season  GET HELP RIGHT AWAY IF:   If your symptoms do not improve within 10 days  You become short of breath  You develop yellow or green discharge from your nose for over 3 days  You have coughing fits  MAKE SURE YOU:   Understand these instructions  Will watch your condition  Will get help right away if you are not doing well or get worse  Thank you for choosing an e-visit. Your e-visit answers were reviewed by a board certified advanced clinical practitioner to complete your personal care plan. Depending upon the condition, your plan could have included both over the counter or prescription medications. Please review your pharmacy choice. Be sure that the pharmacy you have chosen is open so that you can pick up your prescription now.  If there is a problem you may message your provider in MyChart to have the prescription routed to another pharmacy. Your safety is important to us. If you have drug allergies check your prescription carefully.  For the next 24 hours, you can use MyChart to ask questions about today's visit, request a non-urgent call back, or ask for a work or school excuse from your e-visit provider. You will get an email in the next two days asking about your experience. I hope that your e-visit has been valuable and will speed your recovery.           

## 2018-04-15 NOTE — Progress Notes (Signed)
Greater than 5 minutes, yet less than 10 minutes of time have been spent researching, coordinating, and implementing care for this patient today.  Thank you for the details you included in the comment boxes. Those details are very helpful in determining the best course of treatment for you and help [place] to provide the best care.  Typically you would need to have white/milky discharge. That said, most female patients have had a yeast infection previously and are aware of the symptoms. I will send the treatment below. If you develop discharge, you should use it. Otherwise, it is not truly indicated as itching can be caused by numerous factors.   We are sorry that you are not feeling well. Here is how we plan to help! Based on what you shared with me it looks like you: May have a yeast vaginosis  Vaginosis is an inflammation of the vagina that can result in discharge, itching and pain. The cause is usually a change in the normal balance of vaginal bacteria or an infection. Vaginosis can also result from reduced estrogen levels after menopause.  The most common causes of vaginosis are:   Bacterial vaginosis which results from an overgrowth of one on several organisms that are normally present in your vagina.   Yeast infections which are caused by a naturally occurring fungus called candida.   Vaginal atrophy (atrophic vaginosis) which results from the thinning of the vagina from reduced estrogen levels after menopause.   Trichomoniasis which is caused by a parasite and is commonly transmitted by sexual intercourse.  Factors that increase your risk of developing vaginosis include: Marland Kitchen Medications, such as antibiotics and steroids . Uncontrolled diabetes . Use of hygiene products such as bubble bath, vaginal spray or vaginal deodorant . Douching . Wearing damp or tight-fitting clothing . Using an intrauterine device (IUD) for birth control . Hormonal changes, such as those associated with  pregnancy, birth control pills or menopause . Sexual activity . Having a sexually transmitted infection  Your treatment plan is A single Diflucan (fluconazole) 150mg  tablet once.  I have electronically sent this prescription into the pharmacy that you have chosen.  Be sure to take all of the medication as directed. Stop taking any medication if you develop a rash, tongue swelling or shortness of breath. Mothers who are breast feeding should consider pumping and discarding their breast milk while on these antibiotics. However, there is no consensus that infant exposure at these doses would be harmful.  Remember that medication creams can weaken latex condoms. Marland Kitchen   HOME CARE:  Good hygiene may prevent some types of vaginosis from recurring and may relieve some symptoms:  . Avoid baths, hot tubs and whirlpool spas. Rinse soap from your outer genital area after a shower, and dry the area well to prevent irritation. Don't use scented or harsh soaps, such as those with deodorant or antibacterial action. Marland Kitchen Avoid irritants. These include scented tampons and pads. . Wipe from front to back after using the toilet. Doing so avoids spreading fecal bacteria to your vagina.  Other things that may help prevent vaginosis include:  Marland Kitchen Don't douche. Your vagina doesn't require cleansing other than normal bathing. Repetitive douching disrupts the normal organisms that reside in the vagina and can actually increase your risk of vaginal infection. Douching won't clear up a vaginal infection. . Use a latex condom. Both female and female latex condoms may help you avoid infections spread by sexual contact. . Wear cotton underwear. Also wear pantyhose with  a cotton crotch. If you feel comfortable without it, skip wearing underwear to bed. Yeast thrives in [employer] Your symptoms should improve in the next day or two.  GET HELP RIGHT AWAY IF:  . You have pain in your lower abdomen ( pelvic area or over your  ovaries) . You develop nausea or vomiting . You develop a fever . Your discharge changes or worsens . You have persistent pain with intercourse . You develop shortness of breath, a rapid pulse, or you faint.  These symptoms could be signs of problems or infections that need to be evaluated by a medical provider now.  MAKE SURE YOU    Understand these instructions.  Will watch your condition.  Will get help right away if you are not doing well or get worse.  Your e-visit answers were reviewed by a board certified advanced clinical practitioner to complete your personal care plan. Depending upon the condition, your plan could have included both over the counter or prescription medications. Please review your pharmacy choice to make sure that you have choses a pharmacy that is open for you to pick up any needed prescription, Your safety is important to [place]. If you have drug allergies check your prescription carefully.   You can use MyChart to ask questions about today's visit, request a non-urgent call back, or ask for a work or school excuse for 24 hours related to this e-Visit. If it has been greater than 24 hours you will need to follow up with your provider, or enter a new e-Visit to address those concerns. You will get a MyChart message within the next two days asking about your experience. I hope that your e-visit has been valuable and will speed your recovery.

## 2018-06-13 ENCOUNTER — Telehealth: Payer: BC Managed Care – PPO | Admitting: Physician Assistant

## 2018-06-13 DIAGNOSIS — B9689 Other specified bacterial agents as the cause of diseases classified elsewhere: Secondary | ICD-10-CM | POA: Diagnosis not present

## 2018-06-13 DIAGNOSIS — N760 Acute vaginitis: Secondary | ICD-10-CM | POA: Diagnosis not present

## 2018-06-13 MED ORDER — METRONIDAZOLE 500 MG PO TABS: 500.0000 mg | ORAL_TABLET | Freq: Two times a day (BID) | ORAL | 0 refills | Status: DC

## 2018-06-13 MED ORDER — METRONIDAZOLE 500 MG PO TABS: 500.0000 mg | ORAL_TABLET | Freq: Two times a day (BID) | ORAL | 2 refills | Status: DC

## 2018-06-13 NOTE — Addendum Note (Signed)
Addended by: Ofilia Neas on: 06/13/2018 02:12 PM   Modules accepted: Orders

## 2018-06-13 NOTE — Progress Notes (Signed)
We are sorry that you are not feeling well. Here is how we plan to help! Based on what you shared with me it looks like you: May have a vaginosis due to bacteria   Vaginosis is an inflammation of the vagina that can result in discharge, itching and pain. The cause is usually a change in the normal balance of vaginal bacteria or an infection. Vaginosis can also result from reduced estrogen levels after menopause.  The most common causes of vaginosis are:   Bacterial vaginosis which results from an overgrowth of one on several organisms that are normally present in your vagina.   Yeast infections which are caused by a naturally occurring fungus called candida.   Vaginal atrophy (atrophic vaginosis) which results from the thinning of the vagina from reduced estrogen levels after menopause.   Trichomoniasis which is caused by a parasite and is commonly transmitted by sexual intercourse.  Factors that increase your risk of developing vaginosis include: Medications, such as antibiotics and steroids Uncontrolled diabetes Use of hygiene products such as bubble bath, vaginal spray or vaginal deodorant Douching Wearing damp or tight-fitting clothing Using an intrauterine device (IUD) for birth control Hormonal changes, such as those associated with pregnancy, birth control pills or menopause Sexual activity Having a sexually transmitted infection  Your treatment plan is Metronidazole or Flagyl 500mg  twice a day for 7 days.  I have electronically sent this prescription into the pharmacy that you have chosen.  Be sure to take all of the medication as directed. Stop taking any medication if you develop a rash, tongue swelling or shortness of breath. Mothers who are breast feeding should consider pumping and discarding their breast milk while on these antibiotics. However, there is no consensus that infant exposure at these doses would be harmful.  Remember that medication creams can weaken latex  condoms. Melissa Chan   HOME CARE:  Good hygiene may prevent some types of vaginosis from recurring and may relieve some symptoms:  Avoid baths, hot tubs and whirlpool spas. Rinse soap from your outer genital area after a shower, and dry the area well to prevent irritation. Don't use scented or harsh soaps, such as those with deodorant or antibacterial action. Avoid irritants. These include scented tampons and pads. Wipe from front to back after using the toilet. Doing so avoids spreading fecal bacteria to your vagina.  Other things that may help prevent vaginosis include:  Don't douche. Your vagina doesn't require cleansing other than normal bathing. Repetitive douching disrupts the normal organisms that reside in the vagina and can actually increase your risk of vaginal infection. Douching won't clear up a vaginal infection. Use a latex condom. Both female and female latex condoms may help you avoid infections spread by sexual contact. Wear cotton underwear. Also wear pantyhose with a cotton crotch. If you feel comfortable without it, skip wearing underwear to bed. Yeast thrives in [employer] Your symptoms should improve in the next day or two.  GET HELP RIGHT AWAY IF:  You have pain in your lower abdomen ( pelvic area or over your ovaries) You develop nausea or vomiting You develop a fever Your discharge changes or worsens You have persistent pain with intercourse You develop shortness of breath, a rapid pulse, or you faint.  These symptoms could be signs of problems or infections that need to be evaluated by a medical provider now.  MAKE SURE YOU   Understand these instructions. Will watch your condition. Will get help right away if you are  not doing well or get worse.  Your e-visit answers were reviewed by a board certified advanced clinical practitioner to complete your personal care plan. Depending upon the condition, your plan could have included both over the counter or  prescription medications. Please review your pharmacy choice to make sure that you have choses a pharmacy that is open for you to pick up any needed prescription, Your safety is important to us. If you have drug allergies check your prescription carefully.   You can use MyChart to ask questions about today's visit, request a non-urgent call back, or ask for a work or school excuse for 24 hours related to this e-Visit. If it has been greater than 24 hours you will need to follow up with your provider, or enter a new e-Visit to address those concerns. You will get a MyChart message within the next two days asking about your experience. I hope that your e-visit has been valuable and will speed your recovery.  ===View-only below this line===   ----- Message -----    From: Teressa LowerShaquanna L Laning    Sent: 06/13/2018  9:26 AM EDT      To: E-Visit Mailing List Subject: E-Visit Submission: Vaginal Symptoms  E-Visit Submission: Vaginal Symptoms --------------------------------  Question: Which of the following are you experiencing? Answer:   Vaginal itching            Vaginal discharge  Question: Are you having pain while passing urine? Answer:   No, I have no pain while urinating  Question: Which of the following applies to your vaginal discharge Answer:   I have a white/milky discharge  Question: Are you pregnant? Answer:   I am confident that I am not pregnant  Question: Are you breastfeeding? Answer:   No  Question: Which of the following are you experiencing? Answer:   None of the above  Question: Do you have any sores on your genitals? Answer:   No  Question: Have you taken antibiotics recently? Answer:   I have not been on any antibiotics  Question: Do you do any of the following? Answer:   I use douches  Question: Which of the following applies to your menstrual period? Answer:   I expect to have a menstrual period soon  Question: Have you had similar symptoms in the  past? Answer:   Yes, I have had similar symptoms before  Question: When you had similar symptoms in  the past, did any of the following work? Answer:   Pills for yeast infection            Other  Question: Please specify what other treatments worked for you in the past Answer:   Medication for BV  Question: Do you have a fever? Answer:   No, I do not have a fever  Question: During the past 2 months, have you had sexual contact with a specific person for the first time? Answer:   No  Question: Has a person with whom you have had sexual contact been recently told they have a disease possibly acquired through sex? Answer:   No  Question: Please list your medication allergies that you may have ? (If 'none' , please list as 'none') Answer:   None  Question: Please list any additional comments  Answer:   I douched a couple week ago and thought it gave me a yeast infection but the cream did not work and there is a fishy odor with the discharge and some itching.  A  total of 5-10 minutes was spent evaluating this patients questionnaire and formulating a plan of care.

## 2018-08-07 ENCOUNTER — Encounter: Payer: Self-pay | Admitting: Physician Assistant

## 2018-08-07 ENCOUNTER — Telehealth: Payer: BC Managed Care – PPO | Admitting: Physician Assistant

## 2018-08-07 DIAGNOSIS — B3731 Acute candidiasis of vulva and vagina: Secondary | ICD-10-CM

## 2018-08-07 DIAGNOSIS — B373 Candidiasis of vulva and vagina: Secondary | ICD-10-CM

## 2018-08-07 MED ORDER — FLUCONAZOLE 150 MG PO TABS: 150.0000 mg | ORAL_TABLET | Freq: Once | ORAL | 0 refills | Status: AC

## 2018-08-07 NOTE — Progress Notes (Signed)
We are sorry that you are not feeling well. Here is how we plan to help! Based on what you shared with me it looks like you: May have a yeast vaginosis  Vaginosis is an inflammation of the vagina that can result in discharge, itching and pain. The cause is usually a change in the normal balance of vaginal bacteria or an infection. Vaginosis can also result from reduced estrogen levels after menopause.  The most common causes of vaginosis are:   Bacterial vaginosis which results from an overgrowth of one on several organisms that are normally present in your vagina.   Yeast infections which are caused by a naturally occurring fungus called candida.   Vaginal atrophy (atrophic vaginosis) which results from the thinning of the vagina from reduced estrogen levels after menopause.   Trichomoniasis which is caused by a parasite and is commonly transmitted by sexual intercourse.  Factors that increase your risk of developing vaginosis include: . Medications, such as antibiotics and steroids . Uncontrolled diabetes . Use of hygiene products such as bubble bath, vaginal spray or vaginal deodorant . Douching . Wearing damp or tight-fitting clothing . Using an intrauterine device (IUD) for birth control . Hormonal changes, such as those associated with pregnancy, birth control pills or menopause . Sexual activity . Having a sexually transmitted infection  Your treatment plan is A single Diflucan (fluconazole) 150mg tablet once.  I have electronically sent this prescription into the pharmacy that you have chosen.  Be sure to take all of the medication as directed. Stop taking any medication if you develop a rash, tongue swelling or shortness of breath. Mothers who are breast feeding should consider pumping and discarding their breast milk while on these antibiotics. However, there is no consensus that infant exposure at these doses would be harmful.  Remember that medication creams can weaken latex  condoms. .   HOME CARE:  Good hygiene may prevent some types of vaginosis from recurring and may relieve some symptoms:  . Avoid baths, hot tubs and whirlpool spas. Rinse soap from your outer genital area after a shower, and dry the area well to prevent irritation. Don't use scented or harsh soaps, such as those with deodorant or antibacterial action. . Avoid irritants. These include scented tampons and pads. . Wipe from front to back after using the toilet. Doing so avoids spreading fecal bacteria to your vagina.  Other things that may help prevent vaginosis include:  . Don't douche. Your vagina doesn't require cleansing other than normal bathing. Repetitive douching disrupts the normal organisms that reside in the vagina and can actually increase your risk of vaginal infection. Douching won't clear up a vaginal infection. . Use a latex condom. Both female and female latex condoms may help you avoid infections spread by sexual contact. . Wear cotton underwear. Also wear pantyhose with a cotton crotch. If you feel comfortable without it, skip wearing underwear to bed. Yeast thrives in moist environments Your symptoms should improve in the next day or two.  GET HELP RIGHT AWAY IF:  . You have pain in your lower abdomen ( pelvic area or over your ovaries) . You develop nausea or vomiting . You develop a fever . Your discharge changes or worsens . You have persistent pain with intercourse . You develop shortness of breath, a rapid pulse, or you faint.  These symptoms could be signs of problems or infections that need to be evaluated by a medical provider now.  MAKE SURE YOU      Understand these instructions.  Will watch your condition.  Will get help right away if you are not doing well or get worse.  Your e-visit answers were reviewed by a board certified advanced clinical practitioner to complete your personal care plan. Depending upon the condition, your plan could have included  both over the counter or prescription medications. Please review your pharmacy choice to make sure that you have choses a pharmacy that is open for you to pick up any needed prescription, Your safety is important to us. If you have drug allergies check your prescription carefully.   You can use MyChart to ask questions about today's visit, request a non-urgent call back, or ask for a work or school excuse for 24 hours related to this e-Visit. If it has been greater than 24 hours you will need to follow up with your provider, or enter a new e-Visit to address those concerns. You will get a MyChart message within the next two days asking about your experience. I hope that your e-visit has been valuable and will speed your recovery.  I spent 5-10 minutes on review and completion of this note- Hayk Divis PAC  

## 2018-08-17 ENCOUNTER — Telehealth: Payer: Medicaid Other | Admitting: Physician Assistant

## 2018-08-17 DIAGNOSIS — N760 Acute vaginitis: Secondary | ICD-10-CM

## 2018-08-17 NOTE — Progress Notes (Signed)
Based on what you shared with me, I feel your condition warrants further evaluation and I recommend that you be seen for a face to face office visit.  Giving frequent issues per review of e-visits (most recently 08/07/2018) you need to be seen in person for assessment and testing of urine/potential vaginal examination to ensure proper treatment and to help prevent recurrence  NOTE: If you entered your credit card information for this eVisit, you will not be charged. You may see a "hold" on your card for the $35 but that hold will drop off and you will not have a charge processed.  If you are having a true medical emergency please call 911.     For an urgent face to face visit, [facility] has five urgent care centers for your convenience:    [email or web address] to reserve your spot online an avoid wait times  [facility] (New Address!) [address], [place], [place] [address] *Just off [address], across the road from [place] hours of operation: Monday-Friday, 12 PM to 6 PM  Closed Saturday & Sunday   The following sites will take your insurance:  . [facility]    [phone]                  Get Driving Directions  [address], [place] [address] . 10 am to 8 pm Monday-Friday . 12 pm to 8 pm Saturday-Sunday   . [facility] at [place]                  Get Driving Directions  [address], [place], [place] [address] . 8 am to 8 pm Monday-Friday . 9 am to 6 pm Saturday . 11 am to 6 pm Sunday   . [facility] at [place]                  Get Driving Directions   [address], [place] [address] . 8 am to 8 pm Monday-Friday . 8 am to 4 pm Saturday-Sunday    . [facility] at [place]                    Get Driving Directions  [phone]  [address]  [place], [place] [address]  . Monday-Friday, 12 PM to 6 PM    Your e-visit answers were reviewed by a board certified advanced clinical practitioner to complete your personal care plan.  Thank you for using e-Visits.

## 2018-08-25 ENCOUNTER — Encounter: Payer: Self-pay | Admitting: Obstetrics

## 2018-08-25 ENCOUNTER — Other Ambulatory Visit: Payer: Self-pay

## 2018-08-25 ENCOUNTER — Ambulatory Visit (INDEPENDENT_AMBULATORY_CARE_PROVIDER_SITE_OTHER): Payer: BC Managed Care – PPO | Admitting: Obstetrics

## 2018-08-25 ENCOUNTER — Other Ambulatory Visit (HOSPITAL_COMMUNITY)
Admission: RE | Admit: 2018-08-25 | Discharge: 2018-08-25 | Disposition: A | Discharge status: Home / Self Care | Payer: BC Managed Care – PPO | Source: Ambulatory Visit | Attending: Obstetrics | Admitting: Obstetrics

## 2018-08-25 VITALS — BP 116/80 | HR 85 | Wt 267.0 lb

## 2018-08-25 DIAGNOSIS — Z113 Encounter for screening for infections with a predominantly sexual mode of transmission: Secondary | ICD-10-CM

## 2018-08-25 DIAGNOSIS — N898 Other specified noninflammatory disorders of vagina: Secondary | ICD-10-CM

## 2018-08-25 DIAGNOSIS — Z01419 Encounter for gynecological examination (general) (routine) without abnormal findings: Secondary | ICD-10-CM | POA: Insufficient documentation

## 2018-08-25 MED ORDER — FLUCONAZOLE 200 MG PO TABS: 200.0000 mg | ORAL_TABLET | ORAL | 2 refills | Status: DC

## 2018-08-25 MED ORDER — TINIDAZOLE 500 MG PO TABS: 1000.0000 mg | ORAL_TABLET | Freq: Every day | ORAL | 2 refills | Status: DC

## 2018-08-25 NOTE — Progress Notes (Signed)
Subjective:        Melissa Chan is a 28 y.o. female here for a routine exam.  Current complaints: Malodorous vaginal discharge..    Personal health questionnaire:  Is patient Ashkenazi Jewish, have a family history of breast and/or ovarian cancer: no Is there a family history of uterine cancer diagnosed at age < 4150, gastrointestinal cancer, urinary tract cancer, family member who is a Personnel officerLynch syndrome-associated carrier: no Is the patient overweight and hypertensive, family history of diabetes, personal history of gestational diabetes, preeclampsia or PCOS: no Is patient over 6755, have PCOS,  family history of premature CHD under age 30, diabetes, smoke, have hypertension or peripheral artery disease:  no At any time, has a partner hit, kicked or otherwise hurt or frightened you?: no Over the past 2 weeks, have you felt down, depressed or hopeless?: no Over the past 2 weeks, have you felt little interest or pleasure in doing things?:no   Gynecologic History Patient's last menstrual period was 07/27/2018. Contraception: tubal ligation Last Pap: 05-31-2016. Results were: normal Last mammogram: n/a. Results were: n/a  Obstetric History OB History  Gravida Para Term Preterm AB Living  5 5 4 1   5   SAB TAB Ectopic Multiple Live Births        0 5    # Outcome Date GA Lbr Len/2nd Weight Sex Delivery Anes PTL Lv  5 Term 03/11/16 5859w0d 01:29 8 lb 6.8 oz (3.822 kg) F Vag-Spont None  LIV  4 Term 01/14/14 4459w0d 10:47 / 00:05 7 lb 8.8 oz (3.425 kg) F Vag-Spont None  LIV     Birth Comments: WNL  3 Term 03/17/12 3641w0d  6 lb 13 oz (3.09 kg) M    LIV  2 Term 04/04/06 6559w0d  8 lb 14 oz (4.026 kg) M Vag-Spont EPI  LIV  1 Preterm 02/27/05 3889w0d  3 lb 14 oz (1.758 kg) F    LIV    Past Medical History:  Diagnosis Date  . Medical history non-contributory     Past Surgical History:  Procedure Laterality Date  . NO PAST SURGERIES    . TUBAL LIGATION Bilateral 03/12/2016   Procedure: POST  PARTUM TUBAL LIGATION;  Surgeon: Reva Boresanya S Pratt, MD;  Location: [facility];  Service: Gynecology;  Laterality: Bilateral;     Current Outpatient Medications:  .  amoxicillin (AMOXIL) 500 MG capsule, Take 2 capsules (1,000 mg total) by mouth 2 (two) times daily. (Patient not taking: Reported on 08/25/2018), Disp: 40 capsule, Rfl: 0 .  cyclobenzaprine (FLEXERIL) 10 MG tablet, Take 1 tablet (10 mg total) by mouth 3 (three) times daily., Disp: 20 tablet, Rfl: 0 .  diclofenac (VOLTAREN) 75 MG EC tablet, Take 1 tablet (75 mg total) by mouth 2 (two) times daily., Disp: 14 tablet, Rfl: 0 .  fluticasone (FLONASE) 50 MCG/ACT nasal spray, Place 2 sprays into both nostrils daily., Disp: 16 g, Rfl: 6 .  ibuprofen (ADVIL,MOTRIN) 800 MG tablet, Take 1 tablet (800 mg total) by mouth every 8 (eight) hours as needed. (Patient not taking: Reported on 08/25/2018), Disp: 30 tablet, Rfl: 11 .  lidocaine (XYLOCAINE) 2 % solution, Use as directed 5-10 mLs in the mouth or throat every 4 (four) hours as needed for mouth pain. (Patient not taking: Reported on 08/25/2018), Disp: 100 mL, Rfl: 0 .  metFORMIN (GLUCOPHAGE XR) 500 MG 24 hr tablet, Take 2 tablets (1,000 mg total) by mouth 2 (two) times daily after a meal. (Patient not taking: Reported  on 08/25/2018), Disp: 120 tablet, Rfl: 11 .  metroNIDAZOLE (FLAGYL) 500 MG tablet, Take 1 tablet (500 mg total) by mouth 2 (two) times daily., Disp: 14 tablet, Rfl: 0 .  traMADol (ULTRAM) 50 MG tablet, Take 1 tablet (50 mg total) by mouth every 6 (six) hours as needed. (Patient not taking: Reported on 08/25/2018), Disp: 10 tablet, Rfl: 0 No Known Allergies  Social History   Tobacco Use  . Smoking status: Never Smoker  . Smokeless tobacco: Never Used  Substance Use Topics  . Alcohol use: No    Family History  Problem Relation Age of Onset  . Hypertension Mother   . Hypertension Father   . Diabetes Father   . Cancer Maternal Grandmother   . Heart disease Paternal Grandmother        Review of Systems  Constitutional: negative for fatigue and weight loss Respiratory: negative for cough and wheezing Cardiovascular: negative for chest pain, fatigue and palpitations Gastrointestinal: negative for abdominal pain and change in bowel habits Musculoskeletal:negative for myalgias Neurological: negative for gait problems and tremors Behavioral/Psych: negative for abusive relationship, depression Endocrine: negative for temperature intolerance    Genitourinary:negative for abnormal menstrual periods, genital lesions, hot flashes, sexual problems and vaginal discharge Integument/breast: negative for breast lump, breast tenderness, nipple discharge and skin lesion(s)    Objective:       BP 116/80   Pulse 85   Wt 267 lb (121.1 kg)   LMP 07/27/2018   BMI 39.43 kg/m  General:   alert  Skin:   no rash or abnormalities  Lungs:   clear to auscultation bilaterally  Heart:   regular rate and rhythm, S1, S2 normal, no murmur, click, rub or gallop  Breasts:   normal without suspicious masses, skin or nipple changes or axillary nodes  Abdomen:  normal findings: no organomegaly, soft, non-tender and no hernia  Pelvis:  External genitalia: normal general appearance Urinary system: urethral meatus normal and bladder without fullness, nontender Vaginal: normal without tenderness, induration or masses Cervix: normal appearance Adnexa: normal bimanual exam Uterus: anteverted and non-tender, normal size   Lab Review Urine pregnancy test Labs reviewed yes Radiologic studies reviewed no  50% of 25 min visit spent on counseling and coordination of care.   Assessment:     1. Encounter for routine gynecological examination with Papanicolaou smear of cervix Rx: - Cytology - PAP( [facility])  2. Vaginal discharge Rx: - Cervicovaginal ancillary only( [facility]) - fluconazole (DIFLUCAN) 200 MG tablet; Take 1 tablet (200 mg total) by mouth every 3 (three) days.  Dispense: 3  tablet; Refill: 2 - tinidazole (TINDAMAX) 500 MG tablet; Take 2 tablets (1,000 mg total) by mouth daily with breakfast.  Dispense: 10 tablet; Refill: 2  3. Screen for STD (sexually transmitted disease) Rx: - Hepatitis B surface antigen - Hepatitis C antibody - HIV Antibody (routine testing w rflx) - RPR   Plan:    Education reviewed: calcium supplements, depression evaluation, low fat, low cholesterol diet, safe sex/STD prevention, self breast exams, skin cancer screening and weight bearing exercise. Follow up in: 1 year.   No orders of the defined types were placed in this encounter.  Orders Placed This Encounter  Procedures  . Hepatitis B surface antigen  . Hepatitis C antibody  . HIV Antibody (routine testing w rflx)  . RPR    Shelly Bombard MD 08-25-2018

## 2018-08-25 NOTE — Progress Notes (Signed)
Pt is here for annual gyn exam. Last pap 05/31/2016 normal. Pt has noticed discharge with an odor for the past couple of weeks. Pt requests STD screening today. Pt also reports heavy periods. BTL for contraception.

## 2018-08-30 LAB — CYTOLOGY - PAP
Diagnosis: NEGATIVE
HPV: NOT DETECTED

## 2018-09-04 LAB — CERVICOVAGINAL ANCILLARY ONLY
Candida vaginitis: NEGATIVE
Chlamydia: NEGATIVE
Neisseria Gonorrhea: NEGATIVE
Trichomonas: NEGATIVE

## 2018-09-05 ENCOUNTER — Other Ambulatory Visit: Payer: Self-pay | Admitting: Obstetrics

## 2019-01-07 IMAGING — US US MFM OB FOLLOW-UP
1 series · 14 of 28 positions shown · non-contrast
Comparison: none

[Series 1: us mfm ob follow-up · 39 acquisitions, 14 frames shown]
[im 2/39]
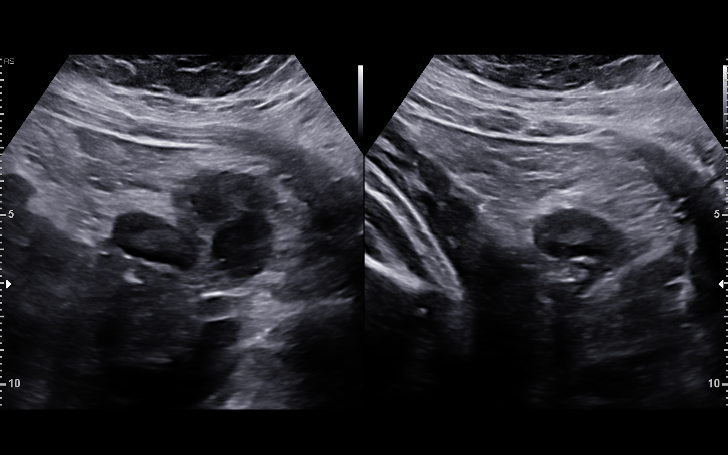
[im 5/39]
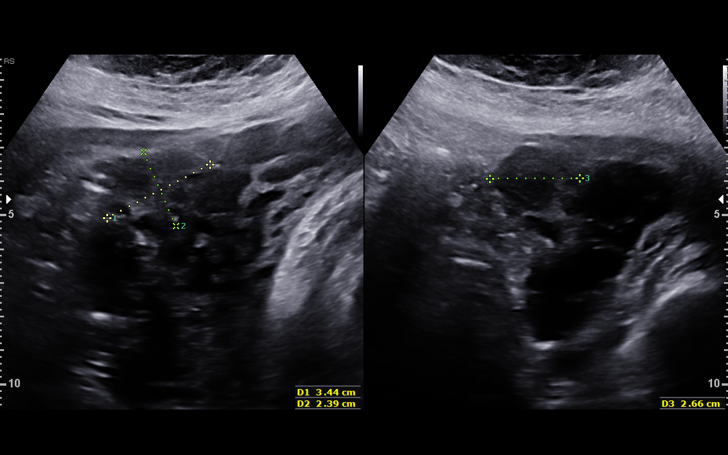
[im 8/39]
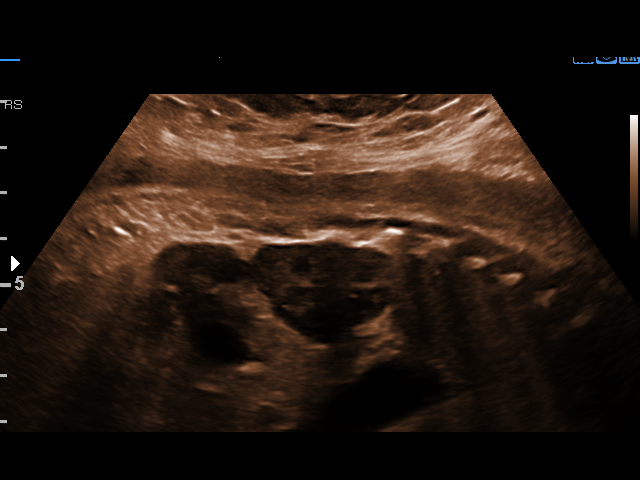
[im 10/39]
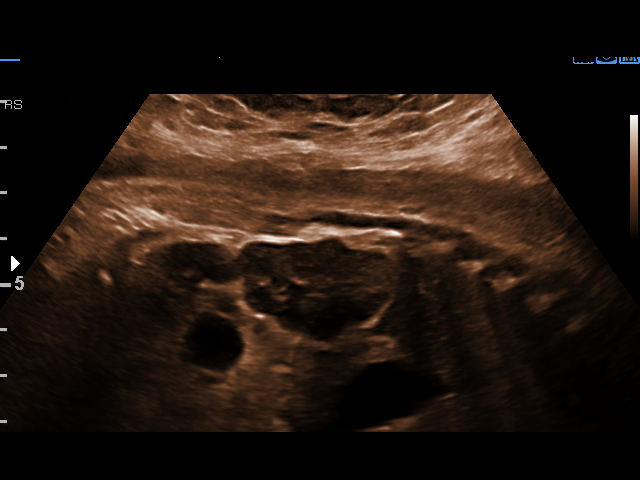
[im 13/39]
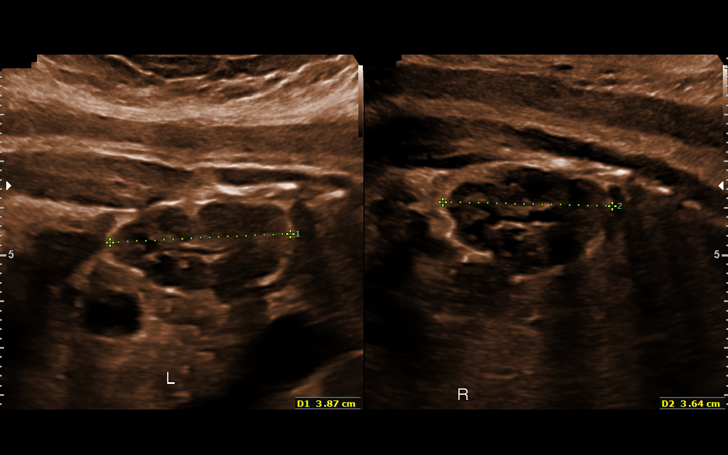
[im 16/39]
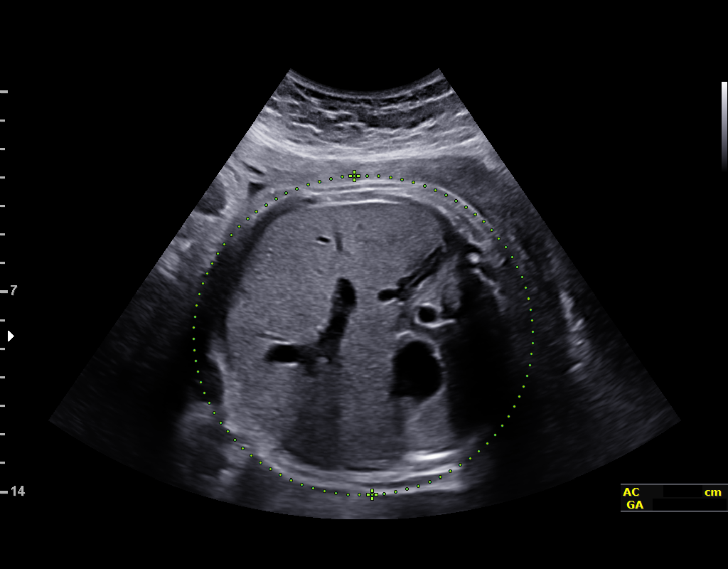
[im 19/39]
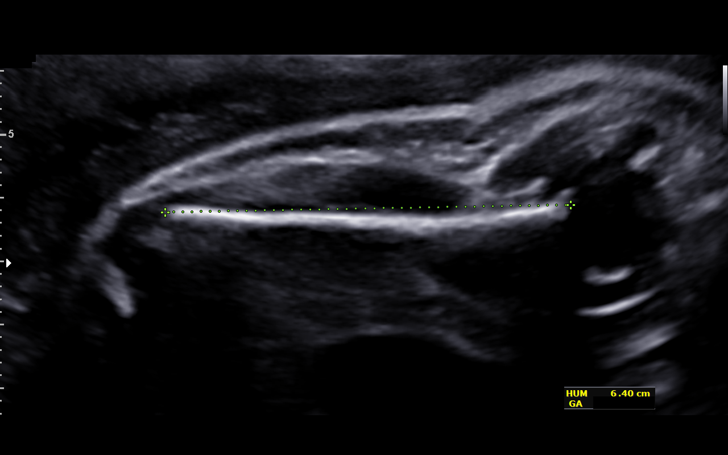
[im 22/39]
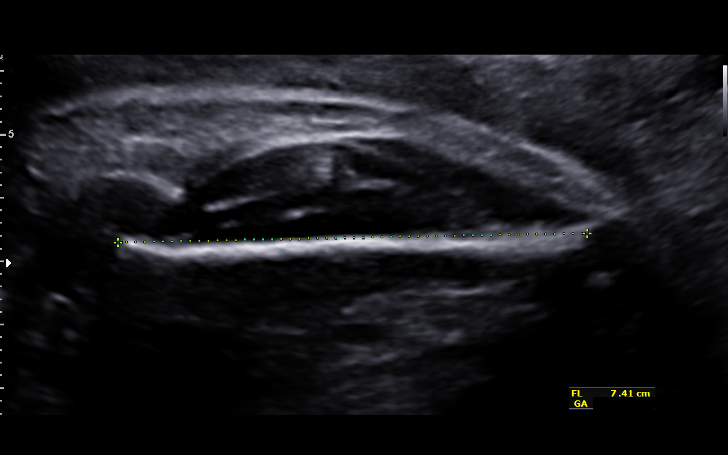
[im 24/39]
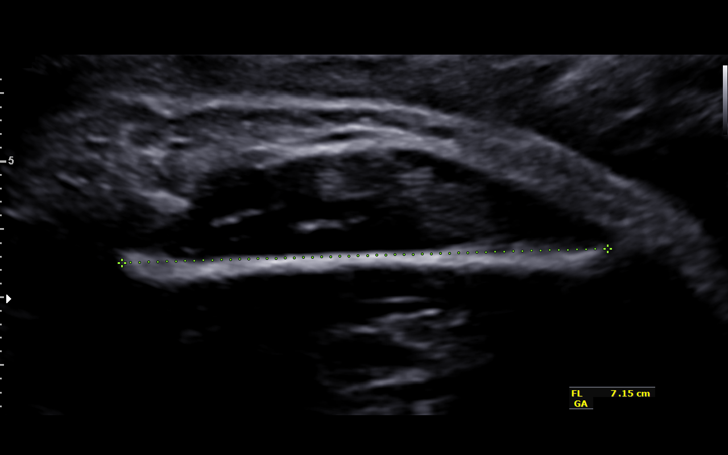
[im 27/39]
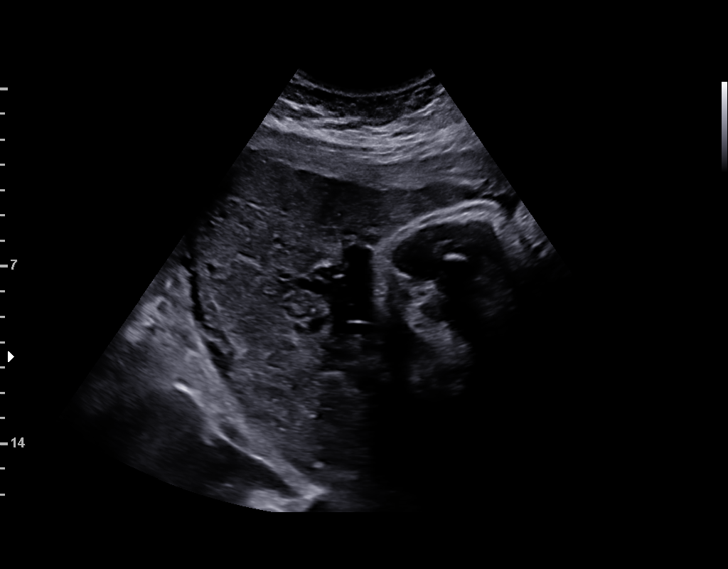
[im 30/39]
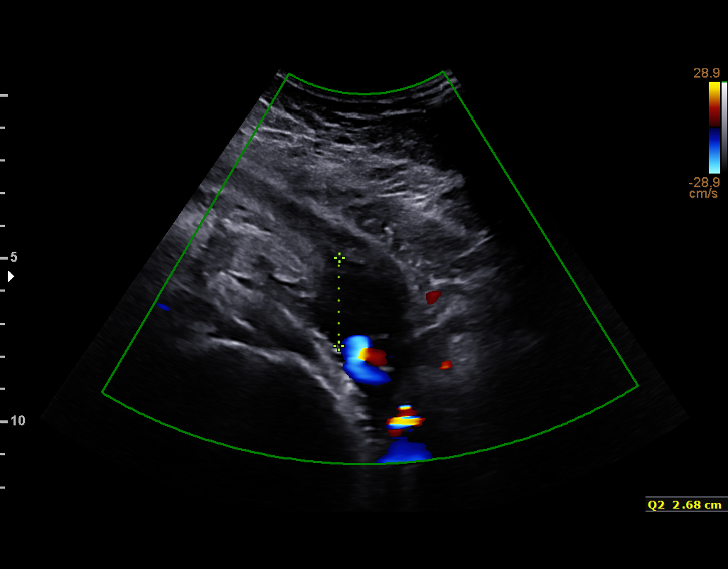
[im 33/39]
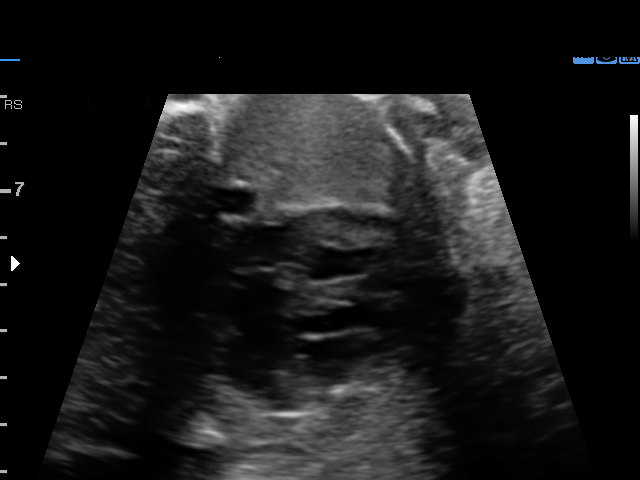
[im 36/39]
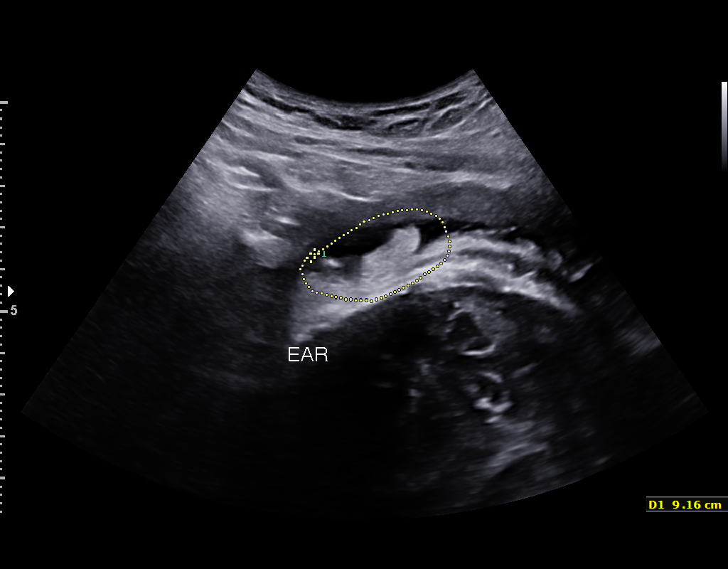
[im 39/39]
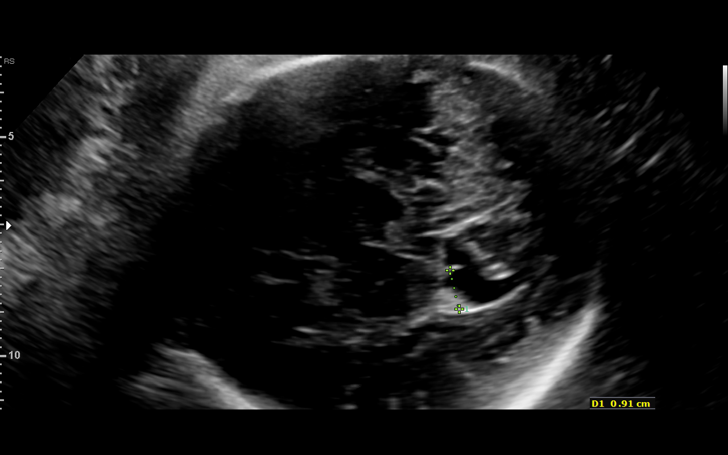

[14 of 28 positions shown; findings below may reference images not displayed]

1  QUIRIJN AMAZIGH           [id number]      [id number]     [id number]
Indications

37 weeks gestation of pregnancy
Poor obstetric history: Previous preterm
delivery, antepartum (34w)
Encounter for other antenatal screening
follow-up
Gestational diabetes in pregnancy, diet
controlled
Obesity complicating pregnancy, third
trimester
OB History

Blood Type:            Height:  5'9"   Weight (lb):  285       BMI:
Gravidity:    5         Term:   3        Prem:   1         SAB:   0
TOP:          0       Ectopic:  0        Living: 4
Fetal Evaluation

Num Of Fetuses:     1
Fetal Heart         141
Rate(bpm):
Cardiac Activity:   Observed
Presentation:       Cephalic
Placenta:           Fundal, above cervical os
P. Cord Insertion:  Previously Visualized

Amniotic Fluid
AFI FV:      Subjectively within normal limits

AFI Sum(cm)     %Tile       Largest Pocket(cm)
14.14           54
RUQ(cm)       RLQ(cm)       LUQ(cm)        LLQ(cm)
5.15
Biometry

BPD:      91.2  mm     G. Age:  37w 0d         51  %    CI:         76.11  %    70 - 86
FL/HC:       22.0  %    20.9 -
HC:      331.3  mm     G. Age:  37w 5d         30  %    HC/AC:       0.90       0.92 -
AC:      366.4  mm     G. Age:  40w 4d       > 97  %    FL/BPD:      79.9  %    71 - 87
FL:       72.9  mm     G. Age:  37w 2d         42  %    FL/AC:       19.9  %    20 - 24
HUM:      63.7  mm     G. Age:  37w 0d         61  %

Est. FW:    1726   gm     8 lb 2 oz   > 90  %
Gestational Age

LMP:           37w 5d        Date:  06/05/15                 EDD:    03/11/16
U/S Today:     38w 1d                                        EDD:    03/08/16
Best:          37w 5d     Det. By:  LMP  (06/05/15)          EDD:    03/11/16
Anatomy

Cranium:               Previously seen        Aortic Arch:            Previously seen
Cavum:                 Previously seen        Ductal Arch:            Previously seen
Ventricles:            Appears normal         Diaphragm:              Previously seen
Choroid Plexus:        Previously seen        Stomach:                Appears normal, left
sided
Cerebellum:            Previously seen        Abdomen:                Appears normal
Posterior Fossa:       Previously seen        Abdominal Wall:         Previously seen
Nuchal Fold:           Not applicable (>20    Cord Vessels:           Previously seen
wks GA)
Face:                  Orbits and profile     Kidneys:                Appear normal
previously seen
Lips:                  Previously seen        Bladder:                Appears normal
Thoracic:              Appears normal         Spine:                  Previously seen
Heart:                 Previously seen        Upper Extremities:      Previously seen
RVOT:                  Previously seen        Lower Extremities:      Previously seen
LVOT:                  Previously seen

Other:  Fetus appears to be a female. Nasal bone previously visualized.
Technically difficult due to maternal habitus.
Cervix Uterus Adnexa

Cervix
Measured transvaginally.

Uterus
No abnormality visualized.

Left Ovary
Within normal limits.

Right Ovary
Not visualized.

Adnexa:       No abnormality visualized. No adnexal mass
visualized.
Impression

SIUP at 37+5 weeks
Cephalic presentation
Normal interval anatomy; anatomic survey complete
Normal amniotic fluid volume
EFW > 90th %tile; AC > 97th %tile; 1726 grams; 8+2; fetus at
risk to be LGA
Recommendations

Follow-up as clinically indicated

## 2019-01-13 ENCOUNTER — Other Ambulatory Visit: Payer: Self-pay | Admitting: Obstetrics

## 2019-01-13 DIAGNOSIS — N898 Other specified noninflammatory disorders of vagina: Secondary | ICD-10-CM

## 2019-02-15 ENCOUNTER — Telehealth: Payer: BC Managed Care – PPO | Admitting: Physician Assistant

## 2019-02-15 DIAGNOSIS — N898 Other specified noninflammatory disorders of vagina: Secondary | ICD-10-CM | POA: Diagnosis not present

## 2019-02-15 MED ORDER — FLUCONAZOLE 200 MG PO TABS: ORAL_TABLET | ORAL | 2 refills | Status: DC

## 2019-02-15 MED ORDER — METRONIDAZOLE 500 MG PO TABS: 500.0000 mg | ORAL_TABLET | Freq: Two times a day (BID) | ORAL | 0 refills | Status: DC

## 2019-02-15 NOTE — Progress Notes (Signed)
We are sorry that you are not feeling well. Here is how we plan to help! Based on what you shared with me it looks like you: May have a yeast vaginosis or a bacterial vaginosis.  Vaginosis is an inflammation of the vagina that can result in discharge, itching and pain. The cause is usually a change in the normal balance of vaginal bacteria or an infection. Vaginosis can also result from reduced estrogen levels after menopause.  The most common causes of vaginosis are:   Bacterial vaginosis which results from an overgrowth of one on several organisms that are normally present in your vagina.   Yeast infections which are caused by a naturally occurring fungus called candida.   Vaginal atrophy (atrophic vaginosis) which results from the thinning of the vagina from reduced estrogen levels after menopause.   Trichomoniasis which is caused by a parasite and is commonly transmitted by sexual intercourse.  Factors that increase your risk of developing vaginosis include: Marland Kitchen Medications, such as antibiotics and steroids . Uncontrolled diabetes . Use of hygiene products such as bubble bath, vaginal spray or vaginal deodorant . Douching . Wearing damp or tight-fitting clothing . Using an intrauterine device (IUD) for birth control . Hormonal changes, such as those associated with pregnancy, birth control pills or menopause . Sexual activity . Having a sexually transmitted infection  Your treatment plan is Metronidazole or Flagyl 500mg  twice a day for 7 days.  I have electronically sent this prescription into the pharmacy that you have chosen.  I will also send a prescription for fluconazole for a possible yeast infection as well.  Be sure to take all of the medication as directed. Stop taking any medication if you develop a rash, tongue swelling or shortness of breath. Mothers who are breast feeding should consider pumping and discarding their breast milk while on these antibiotics. However, there is  no consensus that infant exposure at these doses would be harmful.  Remember that medication creams can weaken latex condoms.   HOME CARE:  Good hygiene may prevent some types of vaginosis from recurring and may relieve some symptoms:  . Avoid baths, hot tubs and whirlpool spas. Rinse soap from your outer genital area after a shower, and dry the area well to prevent irritation. Don't use scented or harsh soaps, such as those with deodorant or antibacterial action. Marland Kitchen Avoid irritants. These include scented tampons and pads. . Wipe from front to back after using the toilet. Doing so avoids spreading fecal bacteria to your vagina.  Other things that may help prevent vaginosis include:  Marland Kitchen Don't douche. Your vagina doesn't require cleansing other than normal bathing. Repetitive douching disrupts the normal organisms that reside in the vagina and can actually increase your risk of vaginal infection. Douching won't clear up a vaginal infection. . Use a latex condom. Both female and female latex condoms may help you avoid infections spread by sexual contact. . Wear cotton underwear. Also wear pantyhose with a cotton crotch. If you feel comfortable without it, skip wearing underwear to bed. Yeast thrives in [place] Kitchen Your symptoms should improve in the next day or two.  GET HELP RIGHT AWAY IF:  . You have pain in your lower abdomen ( pelvic area or over your ovaries) . You develop nausea or vomiting . You develop a fever . Your discharge changes or worsens . You have persistent pain with intercourse . You develop shortness of breath, a rapid pulse, or you faint.  These symptoms  could be signs of problems or infections that need to be evaluated by a medical provider now.  MAKE SURE YOU    Understand these instructions.  Will watch your condition.  Will get help right away if you are not doing well or get worse.  Your e-visit answers were reviewed by a board certified advanced  clinical practitioner to complete your personal care plan. Depending upon the condition, your plan could have included both over the counter or prescription medications. Please review your pharmacy choice to make sure that you have choses a pharmacy that is open for you to pick up any needed prescription, Your safety is important to [place]. If you have drug allergies check your prescription carefully.   You can use MyChart to ask questions about today's visit, request a non-urgent call back, or ask for a work or school excuse for 24 hours related to this e-Visit. If it has been greater than 24 hours you will need to follow up with your provider, or enter a new e-Visit to address those concerns. You will get a MyChart message within the next two days asking about your experience. I hope that your e-visit has been valuable and will speed your recovery.  Greater than 5 minutes, yet less than 10 minutes of time have been spent researching, coordinating, and implementing care for this patient today

## 2019-02-15 NOTE — Addendum Note (Signed)
Addended by: Remus Loffler on: 02/15/2019 04:44 PM   Modules accepted: Orders

## 2019-02-15 NOTE — Progress Notes (Signed)
This appears to be a duplicate of an E-visit completed earlier this morning.

## 2019-03-20 ENCOUNTER — Other Ambulatory Visit: Payer: Self-pay | Admitting: Physician Assistant

## 2019-03-24 ENCOUNTER — Ambulatory Visit: Payer: BC Managed Care – PPO | Attending: Internal Medicine

## 2019-03-28 ENCOUNTER — Encounter: Payer: Self-pay | Admitting: Obstetrics

## 2019-03-28 ENCOUNTER — Other Ambulatory Visit (HOSPITAL_COMMUNITY)
Admission: RE | Admit: 2019-03-28 | Discharge: 2019-03-28 | Disposition: A | Discharge status: Home / Self Care | Payer: BC Managed Care – PPO | Source: Ambulatory Visit | Attending: Obstetrics | Admitting: Obstetrics

## 2019-03-28 ENCOUNTER — Other Ambulatory Visit: Payer: Self-pay

## 2019-03-28 ENCOUNTER — Ambulatory Visit (INDEPENDENT_AMBULATORY_CARE_PROVIDER_SITE_OTHER): Payer: BC Managed Care – PPO | Admitting: Obstetrics

## 2019-03-28 VITALS — BP 127/80 | HR 101 | Wt 249.7 lb

## 2019-03-28 DIAGNOSIS — N898 Other specified noninflammatory disorders of vagina: Secondary | ICD-10-CM

## 2019-03-28 DIAGNOSIS — Z131 Encounter for screening for diabetes mellitus: Secondary | ICD-10-CM | POA: Diagnosis not present

## 2019-03-28 DIAGNOSIS — B3731 Acute candidiasis of vulva and vagina: Secondary | ICD-10-CM

## 2019-03-28 DIAGNOSIS — B373 Candidiasis of vulva and vagina: Secondary | ICD-10-CM | POA: Diagnosis not present

## 2019-03-28 DIAGNOSIS — E669 Obesity, unspecified: Secondary | ICD-10-CM

## 2019-03-28 DIAGNOSIS — N946 Dysmenorrhea, unspecified: Secondary | ICD-10-CM

## 2019-03-28 LAB — POCT URINALYSIS DIPSTICK
Bilirubin, UA: NEGATIVE
Blood, UA: NEGATIVE
Glucose, UA: POSITIVE — AB
Ketones, UA: POSITIVE
Leukocytes, UA: NEGATIVE
Nitrite, UA: NEGATIVE
Protein, UA: NEGATIVE
Spec Grav, UA: 1.015 (ref 1.010–1.025)
Urobilinogen, UA: 0.2 E.U./dL
pH, UA: 7 (ref 5.0–8.0)

## 2019-03-28 MED ORDER — FLUCONAZOLE 200 MG PO TABS: ORAL_TABLET | ORAL | 2 refills | Status: DC

## 2019-03-28 MED ORDER — IBUPROFEN 800 MG PO TABS: 800.0000 mg | ORAL_TABLET | Freq: Three times a day (TID) | ORAL | 5 refills | Status: DC | PRN

## 2019-03-28 MED ORDER — CLOTRIMAZOLE 1 % EX CREA: 1.0000 "application " | TOPICAL_CREAM | Freq: Two times a day (BID) | CUTANEOUS | 2 refills | Status: DC

## 2019-03-28 NOTE — Progress Notes (Signed)
Patient ID: Melissa Chan, female   DOB: 01/19/1991, 29 y.o.   MRN: [id number]  Chief Complaint  Patient presents with  . Vaginitis    HPI Melissa Chan is a 29 y.o. female.  Complains of vaginal discharge and itching.  Has a history of recurrent vaginal yeast infections.  Had GDM during pregnancy.  Her last HgbA1C in 2019 was 7.6, no follow up. HPI  Past Medical History:  Diagnosis Date  . Medical history non-contributory     Past Surgical History:  Procedure Laterality Date  . NO PAST SURGERIES    . TUBAL LIGATION Bilateral 03/12/2016   Procedure: POST PARTUM TUBAL LIGATION;  Surgeon: Donnamae Jude, MD;  Location: [facility];  Service: Gynecology;  Laterality: Bilateral;    Family History  Problem Relation Age of Onset  . Hypertension Mother   . Hypertension Father   . Diabetes Father   . Cancer Maternal Grandmother   . Heart disease Paternal Grandmother     Social History Social History   Tobacco Use  . Smoking status: Never Smoker  . Smokeless tobacco: Never Used  Substance Use Topics  . Alcohol use: No  . Drug use: No    No Known Allergies  Current Outpatient Medications  Medication Sig Dispense Refill  . amoxicillin (AMOXIL) 500 MG capsule Take 2 capsules (1,000 mg total) by mouth 2 (two) times daily. (Patient not taking: Reported on 08/25/2018) 40 capsule 0  . clotrimazole (LOTRIMIN) 1 % cream Apply 1 application topically 2 (two) times daily. 113 g 2  . cyclobenzaprine (FLEXERIL) 10 MG tablet Take 1 tablet (10 mg total) by mouth 3 (three) times daily. 20 tablet 0  . diclofenac (VOLTAREN) 75 MG EC tablet Take 1 tablet (75 mg total) by mouth 2 (two) times daily. 14 tablet 0  . fluconazole (DIFLUCAN) 200 MG tablet TAKE 1 TABLET BY MOUTH EVERY 3 DAYS 2 tablet 2  . fluticasone (FLONASE) 50 MCG/ACT nasal spray Place 2 sprays into both nostrils daily. 16 g 6  . ibuprofen (ADVIL) 800 MG tablet Take 1 tablet (800 mg total) by mouth every 8 (eight) hours as  needed. 30 tablet 5  . lidocaine (XYLOCAINE) 2 % solution Use as directed 5-10 mLs in the mouth or throat every 4 (four) hours as needed for mouth pain. (Patient not taking: Reported on 08/25/2018) 100 mL 0  . metFORMIN (GLUCOPHAGE XR) 500 MG 24 hr tablet Take 2 tablets (1,000 mg total) by mouth 2 (two) times daily after a meal. (Patient not taking: Reported on 08/25/2018) 120 tablet 11  . metroNIDAZOLE (FLAGYL) 500 MG tablet Take 1 tablet (500 mg total) by mouth 2 (two) times daily. One po bid x 7 days (Patient not taking: Reported on 03/28/2019) 14 tablet 0  . tinidazole (TINDAMAX) 500 MG tablet Take 2 tablets (1,000 mg total) by mouth daily with breakfast. (Patient not taking: Reported on 03/28/2019) 10 tablet 2  . traMADol (ULTRAM) 50 MG tablet Take 1 tablet (50 mg total) by mouth every 6 (six) hours as needed. (Patient not taking: Reported on 08/25/2018) 10 tablet 0   No current facility-administered medications for this visit.    Review of Systems Review of Systems Constitutional: negative for fatigue and weight loss Respiratory: negative for cough and wheezing Cardiovascular: negative for chest pain, fatigue and palpitations Gastrointestinal: negative for abdominal pain and change in bowel habits Genitourinary:positive for vaginal discharge and itching Integument/breast: negative for nipple discharge Musculoskeletal:negative for myalgias Neurological: negative for gait  problems and tremors Behavioral/Psych: negative for abusive relationship, depression Endocrine: negative for temperature intolerance      Blood pressure 127/80, pulse (!) 101, weight 249 lb 11.2 oz (113.3 kg), last menstrual period 02/28/2019.  Labs: Results for Melissa Chan, Melissa Chan (MRN [id number]) as of 03/28/2019 15:38  Ref. Range 03/28/2019 14:38  Bilirubin, UA Unknown neg  Clarity, UA Unknown clear  Color, UA Unknown yellow  Glucose Latest Ref Range: Negative  Positive (A)  Ketones, UA Unknown positive  Leukocytes,UA  Latest Ref Range: Negative  Negative  Nitrite, UA Unknown negative  pH, UA Latest Ref Range: 5.0 - 8.0  7.0  Protein,UA Latest Ref Range: Negative  Negative  Specific Gravity, UA Latest Ref Range: 1.010 - 1.025  1.015  Urobilinogen, UA Latest Ref Range: 0.2 or 1.0 E.U./dL 0.2  RBC, UA Unknown negative   Physical Exam Physical Exam General:   alert  Skin:   no rash or abnormalities  Lungs:   clear to auscultation bilaterally  Heart:   regular rate and rhythm, S1, S2 normal, no murmur, click, rub or gallop  Breasts:   normal without suspicious masses, skin or nipple changes or axillary nodes  Abdomen:  normal findings: no organomegaly, soft, non-tender and no hernia  Pelvis:  External genitalia: normal general appearance Urinary system: urethral meatus normal and bladder without fullness, nontender Vaginal: normal without tenderness, induration or masses.  White, curdy, yeast-like discharge Cervix: normal appearance Adnexa: normal bimanual exam Uterus: anteverted and non-tender, normal size    50% of 25 min visit spent on counseling and coordination of care.   Data Reviewed Wet Prep HgbA1C  Assessment     1. Vaginal discharge Rx: - Cervicovaginal ancillary only( [place]) - fluconazole (DIFLUCAN) 200 MG tablet; TAKE 1 TABLET BY MOUTH EVERY 3 DAYS  Dispense: 2 tablet; Refill: 2  2. Candida vaginitis Rx: - clotrimazole (LOTRIMIN) 1 % cream; Apply 1 application topically 2 (two) times daily.  Dispense: 113 g; Refill: 2  3. Screening for diabetes mellitus (DM) Rx: - Hemoglobin A1c - POCT Urinalysis Dipstick  4. Dysmenorrhea Rx: - ibuprofen (ADVIL) 800 MG tablet; Take 1 tablet (800 mg total) by mouth every 8 (eight) hours as needed.  Dispense: 30 tablet; Refill: 5  5. Obesity (BMI 35.0-39.9 without comorbidity) - program of caloric reduction, exercise and behavioral modification recommended    Plan  She will need referral to Endocrinologist for Type 2 DM  Follow up  in 6 months for Annual / Pap  Orders Placed This Encounter  Procedures  . Hemoglobin A1c  . POCT Urinalysis Dipstick   Meds ordered this encounter  Medications  . DISCONTD: fluconazole (DIFLUCAN) 200 MG tablet    Sig: TAKE 1 TABLET BY MOUTH EVERY 3 DAYS    Dispense:  2 tablet    Refill:  2  . DISCONTD: clotrimazole (LOTRIMIN) 1 % cream    Sig: Apply 1 application topically 2 (two) times daily.    Dispense:  113 g    Refill:  2  . ibuprofen (ADVIL) 800 MG tablet    Sig: Take 1 tablet (800 mg total) by mouth every 8 (eight) hours as needed.    Dispense:  30 tablet    Refill:  5  . fluconazole (DIFLUCAN) 200 MG tablet    Sig: TAKE 1 TABLET BY MOUTH EVERY 3 DAYS    Dispense:  2 tablet    Refill:  2  . clotrimazole (LOTRIMIN) 1 % cream    Sig: Apply  1 application topically 2 (two) times daily.    Dispense:  113 g    Refill:  2     Brock Bad, MD 03/28/2019 3:39 PM

## 2019-03-28 NOTE — Progress Notes (Signed)
Pt is here with c/o vaginal discharge, itching, and odor. Pt reports she is sweating a lot.

## 2019-03-29 ENCOUNTER — Other Ambulatory Visit: Payer: Self-pay | Admitting: Obstetrics

## 2019-03-29 DIAGNOSIS — E139 Other specified diabetes mellitus without complications: Secondary | ICD-10-CM

## 2019-03-29 LAB — CERVICOVAGINAL ANCILLARY ONLY
Bacterial Vaginitis (gardnerella): NEGATIVE
Candida Glabrata: POSITIVE — AB
Candida Vaginitis: POSITIVE — AB
Chlamydia: NEGATIVE
Comment: NEGATIVE
Comment: NEGATIVE
Comment: NEGATIVE
Comment: NEGATIVE
Comment: NEGATIVE
Comment: NORMAL
Neisseria Gonorrhea: NEGATIVE
Trichomonas: NEGATIVE

## 2019-03-29 LAB — HEMOGLOBIN A1C
Est. average glucose Bld gHb Est-mCnc: 367 mg/dL
Hgb A1c MFr Bld: 14.4 % — ABNORMAL HIGH (ref 4.8–5.6)

## 2019-03-29 MED ORDER — METFORMIN HCL 1000 MG PO TABS: 1000.0000 mg | ORAL_TABLET | Freq: Two times a day (BID) | ORAL | 11 refills | Status: DC

## 2019-05-23 ENCOUNTER — Other Ambulatory Visit: Payer: Self-pay

## 2019-05-23 ENCOUNTER — Ambulatory Visit (INDEPENDENT_AMBULATORY_CARE_PROVIDER_SITE_OTHER): Payer: BC Managed Care – PPO | Admitting: Internal Medicine

## 2019-05-23 ENCOUNTER — Encounter: Payer: Self-pay | Admitting: Internal Medicine

## 2019-05-23 VITALS — BP 118/82 | HR 99 | Temp 98.5°F | Ht 69.0 in | Wt 244.4 lb

## 2019-05-23 DIAGNOSIS — E1165 Type 2 diabetes mellitus with hyperglycemia: Secondary | ICD-10-CM | POA: Diagnosis not present

## 2019-05-23 DIAGNOSIS — E785 Hyperlipidemia, unspecified: Secondary | ICD-10-CM | POA: Diagnosis not present

## 2019-05-23 DIAGNOSIS — R739 Hyperglycemia, unspecified: Secondary | ICD-10-CM | POA: Diagnosis not present

## 2019-05-23 LAB — MICROALBUMIN / CREATININE URINE RATIO
Creatinine,U: 78.8 mg/dL
Microalb Creat Ratio: 2.1 mg/g (ref 0.0–30.0)
Microalb, Ur: 1.7 mg/dL (ref 0.0–1.9)

## 2019-05-23 LAB — LIPID PANEL
Cholesterol: 183 mg/dL (ref 0–200)
HDL: 43.3 mg/dL (ref >39.00)
LDL Cholesterol: 116 mg/dL — ABNORMAL HIGH (ref 0–99)
NonHDL: 139.91
Total CHOL/HDL Ratio: 4
Triglycerides: 121 mg/dL (ref 0.0–149.0)
VLDL: 24.2 mg/dL (ref 0.0–40.0)

## 2019-05-23 LAB — BASIC METABOLIC PANEL
BUN: 10 mg/dL (ref 6–23)
CO2: 28 mEq/L (ref 19–32)
Calcium: 9.1 mg/dL (ref 8.4–10.5)
Chloride: 98 mEq/L (ref 96–112)
Creatinine, Ser: 0.76 mg/dL (ref 0.40–1.20)
GFR: 107.34 mL/min (ref >60.00)
Glucose, Bld: 330 mg/dL — ABNORMAL HIGH (ref 70–99)
Potassium: 4.4 mEq/L (ref 3.5–5.1)
Sodium: 133 mEq/L — ABNORMAL LOW (ref 135–145)

## 2019-05-23 LAB — GLUCOSE, POCT (MANUAL RESULT ENTRY): POC Glucose: 272 mg/dl — AB (ref 70–99)

## 2019-05-23 MED ORDER — TRESIBA FLEXTOUCH 100 UNIT/ML ~~LOC~~ SOPN: 20.0000 [IU] | PEN_INJECTOR | Freq: Every day | SUBCUTANEOUS | 6 refills | Status: DC

## 2019-05-23 MED ORDER — METFORMIN HCL 1000 MG PO TABS: 1000.0000 mg | ORAL_TABLET | Freq: Every day | ORAL | 1 refills | Status: DC

## 2019-05-23 MED ORDER — INSULIN PEN NEEDLE 32G X 4 MM MISC: 1.0000 | Freq: Every day | 3 refills | Status: DC

## 2019-05-23 MED ORDER — ACCU-CHEK GUIDE VI STRP: ORAL_STRIP | 12 refills | Status: DC

## 2019-05-23 NOTE — Patient Instructions (Addendum)
-   Decrease Metformin 1000 mg to ONE tablet a day with a meal  - Start Tresiba 20 units daily ( insulin)     - Check fasting sugars when you can    - Choose healthy, lower carb lower calorie snacks: toss salad, cooked vegetables, cottage cheese, peanut butter, low fat cheese / string cheese, lower sodium deli meat, tuna salad or chicken salad     - HOW TO TREAT LOW BLOOD SUGARS (Blood sugar LESS THAN 70 MG/DL)  Please follow the RULE OF 15 for the treatment of hypoglycemia treatment (when your (blood sugars are less than 70 mg/dL)    STEP 1: Take 15 grams of carbohydrates when your blood sugar is low, which includes:   3-4 GLUCOSE TABS  OR  3-4 OZ OF JUICE OR REGULAR SODA OR  ONE TUBE OF GLUCOSE GEL     STEP 2: RECHECK blood sugar in 15 MINUTES STEP 3: If your blood sugar is still low at the 15 minute recheck --> then, go back to STEP 1 and treat AGAIN with another 15 grams of carbohydrates.

## 2019-05-23 NOTE — Progress Notes (Signed)
Name: Melissa Chan  MRN/ DOB: [id number], 1990-11-23   Age/ Sex: 29 y.o., female    PCP: Melissa Chan, No Pcp Per   Reason for Endocrinology Evaluation: Type 2 Diabetes Mellitus     Date of Initial Endocrinology Visit: 05/23/2019     Melissa Chan IDENTIFIER: Melissa Chan is a 29 y.o. female with a past medical history of T2DM. The Melissa Chan presented for initial endocrinology clinic visit on 05/23/2019 for consultative assistance with her diabetes management.    HPI: Melissa Chan was    Diagnosed with gestational diabetes in 2018, which was treated with diet, which carried on post-delivery Prior Medications tried/Intolerance: started Metformin 03/2019 Currently checking blood sugars 0 x / day Hypoglycemia episodes : no           Hemoglobin A1c has ranged from 7.6% in 2019, peaking at 14.4% in 2021. Melissa Chan required assistance for hypoglycemia: no  Melissa Chan has required hospitalization within the last 1 year from hyper or hypoglycemia: no  In terms of diet, the Melissa Chan eats 2-3 meals a day, snacks once a day, occasional sugar- sweetened beverages.    HOME DIABETES REGIMEN: Metformin 1000 mg BID   Statin: no ACE-I/ARB: no  Prior Diabetic Education: Yes    METER DOWNLOAD SUMMARY: Does not check   DIABETIC COMPLICATIONS: Microvascular complications:    Denies: neurology, retinopathy, CKD  Last eye exam: Completed yrs ago  Macrovascular complications:    Denies: CAD, PVD, CVA   PAST HISTORY: Past Medical History:  Past Medical History:  Diagnosis Date  . Medical history non-contributory     Past Surgical History:  Past Surgical History:  Procedure Laterality Date  . NO PAST SURGERIES    . TUBAL LIGATION Bilateral 03/12/2016   Procedure: POST PARTUM TUBAL LIGATION;  Surgeon: Reva Bores, MD;  Location: [facility];  Service: Gynecology;  Laterality: Bilateral;      Social History:  reports that Melissa Chan has never smoked. Melissa Chan has never used smokeless tobacco. Melissa Chan  reports that Melissa Chan does not drink alcohol or use drugs. Family History:  Family History  Problem Relation Age of Onset  . Hypertension Mother   . Hypertension Father   . Diabetes Father   . Cancer Maternal Grandmother   . Heart disease Paternal Grandmother       HOME MEDICATIONS: Allergies as of 05/23/2019   No Known Allergies     Medication List       Accurate as of May 23, 2019  9:59 AM. If you have any questions, ask your nurse or doctor.        amoxicillin 500 MG capsule Commonly known as: AMOXIL Take 2 capsules (1,000 mg total) by mouth 2 (two) times daily.   clotrimazole 1 % cream Commonly known as: LOTRIMIN Apply 1 application topically 2 (two) times daily.   cyclobenzaprine 10 MG tablet Commonly known as: FLEXERIL Take 1 tablet (10 mg total) by mouth 3 (three) times daily.   diclofenac 75 MG EC tablet Commonly known as: VOLTAREN Take 1 tablet (75 mg total) by mouth 2 (two) times daily.   fluconazole 200 MG tablet Commonly known as: DIFLUCAN TAKE 1 TABLET BY MOUTH EVERY 3 DAYS   fluticasone 50 MCG/ACT nasal spray Commonly known as: FLONASE Place 2 sprays into both nostrils daily.   ibuprofen 800 MG tablet Commonly known as: ADVIL Take 1 tablet (800 mg total) by mouth every 8 (eight) hours as needed.   lidocaine 2 % solution Commonly known as: XYLOCAINE Use  as directed 5-10 mLs in the mouth or throat every 4 (four) hours as needed for mouth pain.   metFORMIN 1000 MG tablet Commonly known as: Glucophage Take 1 tablet (1,000 mg total) by mouth 2 (two) times daily with a meal.   metroNIDAZOLE 500 MG tablet Commonly known as: Flagyl Take 1 tablet (500 mg total) by mouth 2 (two) times daily. One po bid x 7 days   tinidazole 500 MG tablet Commonly known as: Tindamax Take 2 tablets (1,000 mg total) by mouth daily with breakfast.   traMADol 50 MG tablet Commonly known as: ULTRAM Take 1 tablet (50 mg total) by mouth every 6 (six) hours as needed.          ALLERGIES: No Known Allergies   REVIEW OF SYSTEMS: A comprehensive ROS was conducted with the Melissa Chan and is negative except as per HPI and below:  Review of Systems  Eyes: Positive for blurred vision. Negative for pain.  Gastrointestinal: Positive for diarrhea. Negative for nausea.  Genitourinary: Positive for frequency.  Neurological: Negative for tingling and tremors.  Endo/Heme/Allergies: Positive for polydipsia.      OBJECTIVE:   VITAL SIGNS: BP 118/82 (BP Location: Left Arm, Melissa Chan Position: Sitting, Cuff Size: Large)   Pulse 99   Temp 98.5 F (36.9 C)   Ht 5\' 9"  (1.753 m)   Wt 244 lb 6.4 oz (110.9 kg)   LMP 04/27/2019 (Exact Date)   SpO2 99%   BMI 36.09 kg/m    PHYSICAL EXAM:  General: Melissa Chan appears well and is in NAD  HEENT:  Eyes: External eye exam normal without stare, lid lag or exophthalmos.  EOM intact.  Neck: General: Supple without adenopathy or carotid bruits. Thyroid: Thyroid size normal.  No goiter or nodules appreciated. No thyroid bruit.  Lungs: Clear with good BS bilat with no rales, rhonchi, or wheezes  Heart: RRR with normal S1 and S2 and no gallops; no murmurs; no rub  Abdomen: Normoactive bowel sounds, soft, nontender, without masses or organomegaly palpable  Extremities:  Lower extremities - No pretibial edema. No lesions.  Skin: Normal texture and temperature to palpation.   Neuro: MS is good with appropriate affect, Melissa Chan is alert and Ox3    DM foot exam: 05/23/2019  The skin of the feet is intact without sores or ulcerations. The pedal pulses are 2+ on right and 2+ on left. The sensation is intact to a screening 5.07, 10 gram monofilament bilaterally   DATA REVIEWED:  Lab Results  Component Value Date   HGBA1C 14.4 (H) 03/28/2019   HGBA1C 7.6 (H) 02/23/2017   Results for Melissa Chan (MRN [id number]) as of 05/24/2019 07:00  Ref. Range 05/23/2019 10:59  Sodium Latest Ref Range: 135 - 145 mEq/L 133 (L)  Potassium Latest  Ref Range: 3.5 - 5.1 mEq/L 4.4  Chloride Latest Ref Range: 96 - 112 mEq/L 98  CO2 Latest Ref Range: 19 - 32 mEq/L 28  Glucose Latest Ref Range: 70 - 99 mg/dL 330 (H)  BUN Latest Ref Range: 6 - 23 mg/dL 10  Creatinine Latest Ref Range: 0.40 - 1.20 mg/dL 0.76  Calcium Latest Ref Range: 8.4 - 10.5 mg/dL 9.1  GFR Latest Ref Range: >60.00 mL/min 107.34  Total CHOL/HDL Ratio Unknown 4  Cholesterol Latest Ref Range: 0 - 200 mg/dL 183  HDL Cholesterol Latest Ref Range: >39.00 mg/dL 43.30  LDL (calc) Latest Ref Range: 0 - 99 mg/dL 116 (H)  MICROALB/CREAT RATIO Latest Ref Range: 0.0 - 30.0 mg/g  2.1  NonHDL Unknown 139.91  Triglycerides Latest Ref Range: 0.0 - 149.0 mg/dL 630.1  VLDL Latest Ref Range: 0.0 - 40.0 mg/dL 60.1  Creatinine,U Latest Units: mg/dL 09.3  Microalb, Ur Latest Ref Range: 0.0 - 1.9 mg/dL 1.7   ASSESSMENT / PLAN / RECOMMENDATIONS:   1) Type 2 Diabetes Mellitus, Poorly controlled, Without complications - Most recent A1c of 14.4 %. Goal A1c < 7.0 %.    Plan: GENERAL: I have discussed with the Melissa Chan the pathophysiology of diabetes. We went over the natural progression of the disease. We talked about both insulin resistance and insulin deficiency. We stressed the importance of lifestyle changes including diet and exercise. I explained the complications associated with diabetes including retinopathy, nephropathy, neuropathy as well as increased risk of cardiovascular disease. We went over the benefit seen with glycemic control.    I explained to the Melissa Chan that diabetic patients are at higher than normal risk for amputations.   Melissa Chan will be started on long acting insulin as below, Melissa Chan was trained on pen use in the office, Melissa Chan was provided with a glucose meter, we discussed the importance of having glucose checks at home and having that data available to me  Melissa Chan is intolerant to BID dosing of metformin, will reduce to once daily and proceed from there.   MEDICATIONS: Decrease  Metformin 1000 mg to ONE tablet a day with a meal  Start Tresiba 20 units daily    EDUCATION / INSTRUCTIONS:  BG monitoring instructions: Melissa Chan is instructed to check her blood sugars 1 times a day, fasting.  Call [facility] if: BG persistently < 70 or > 300. . I reviewed the Rule of 15 for the treatment of hypoglycemia in detail with the Melissa Chan. Literature supplied.   2) Diabetic complications:   Eye: Does not have known diabetic retinopathy.   Neuro/ Feet: Does not have known diabetic peripheral neuropathy.  Renal: Melissa Chan does not have known baseline CKD. Melissa Chan is not on an ACEI/ARB at present.Check urine albumin/creatinine ratio is normal  And up to date.     3) Dyslipidemia: Lipid profile is acceptable, LDL above goal, will start with lifestyle changes, no indication for medication at this time. Will continue to monitor      F/U in 8 weeks     Signed electronically by: Lyndle Herrlich, MD  [facility]  [facility] [address] [place], [place] [address] Phone: [phone] FAX: [phone]   CC: Melissa Chan, No Pcp Per No address on file Phone: None  Fax: None    Return to Endocrinology clinic as below: No future appointments.

## 2019-05-24 DIAGNOSIS — E785 Hyperlipidemia, unspecified: Secondary | ICD-10-CM | POA: Insufficient documentation

## 2019-06-11 ENCOUNTER — Telehealth: Payer: BC Managed Care – PPO | Admitting: Nurse Practitioner

## 2019-06-11 DIAGNOSIS — N898 Other specified noninflammatory disorders of vagina: Secondary | ICD-10-CM | POA: Diagnosis not present

## 2019-06-11 MED ORDER — FLUCONAZOLE 150 MG PO TABS: 150.0000 mg | ORAL_TABLET | Freq: Once | ORAL | 0 refills | Status: AC

## 2019-06-11 NOTE — Progress Notes (Signed)

## 2019-07-08 ENCOUNTER — Telehealth: Payer: BC Managed Care – PPO | Admitting: Physician Assistant

## 2019-07-08 DIAGNOSIS — N760 Acute vaginitis: Secondary | ICD-10-CM | POA: Diagnosis not present

## 2019-07-08 MED ORDER — FLUCONAZOLE 150 MG PO TABS: 150.0000 mg | ORAL_TABLET | Freq: Once | ORAL | 0 refills | Status: AC

## 2019-07-08 NOTE — Progress Notes (Signed)
We are sorry that you are not feeling well. Here is how we plan to help! Based on what you shared with me it looks like you: May have a yeast vaginosis  Vaginosis is an inflammation of the vagina that can result in discharge, itching and pain. The cause is usually a change in the normal balance of vaginal bacteria or an infection. Vaginosis can also result from reduced estrogen levels after menopause.  The most common causes of vaginosis are:   Bacterial vaginosis which results from an overgrowth of one on several organisms that are normally present in your vagina.   Yeast infections which are caused by a naturally occurring fungus called candida.   Vaginal atrophy (atrophic vaginosis) which results from the thinning of the vagina from reduced estrogen levels after menopause.   Trichomoniasis which is caused by a parasite and is commonly transmitted by sexual intercourse.  Factors that increase your risk of developing vaginosis include: . Medications, such as antibiotics and steroids . Uncontrolled diabetes . Use of hygiene products such as bubble bath, vaginal spray or vaginal deodorant . Douching . Wearing damp or tight-fitting clothing . Using an intrauterine device (IUD) for birth control . Hormonal changes, such as those associated with pregnancy, birth control pills or menopause . Sexual activity . Having a sexually transmitted infection  Your treatment plan is A single Diflucan (fluconazole) 150mg tablet once.  I have electronically sent this prescription into the pharmacy that you have chosen.  Be sure to take all of the medication as directed. Stop taking any medication if you develop a rash, tongue swelling or shortness of breath. Mothers who are breast feeding should consider pumping and discarding their breast milk while on these antibiotics. However, there is no consensus that infant exposure at these doses would be harmful.  Remember that medication creams can weaken latex  condoms. .   HOME CARE:  Good hygiene may prevent some types of vaginosis from recurring and may relieve some symptoms:  . Avoid baths, hot tubs and whirlpool spas. Rinse soap from your outer genital area after a shower, and dry the area well to prevent irritation. Don't use scented or harsh soaps, such as those with deodorant or antibacterial action. . Avoid irritants. These include scented tampons and pads. . Wipe from front to back after using the toilet. Doing so avoids spreading fecal bacteria to your vagina.  Other things that may help prevent vaginosis include:  . Don't douche. Your vagina doesn't require cleansing other than normal bathing. Repetitive douching disrupts the normal organisms that reside in the vagina and can actually increase your risk of vaginal infection. Douching won't clear up a vaginal infection. . Use a latex condom. Both female and female latex condoms may help you avoid infections spread by sexual contact. . Wear cotton underwear. Also wear pantyhose with a cotton crotch. If you feel comfortable without it, skip wearing underwear to bed. Yeast thrives in moist environments Your symptoms should improve in the next day or two.  GET HELP RIGHT AWAY IF:  . You have pain in your lower abdomen ( pelvic area or over your ovaries) . You develop nausea or vomiting . You develop a fever . Your discharge changes or worsens . You have persistent pain with intercourse . You develop shortness of breath, a rapid pulse, or you faint.  These symptoms could be signs of problems or infections that need to be evaluated by a medical provider now.  MAKE SURE YOU      Understand these instructions.  Will watch your condition.  Will get help right away if you are not doing well or get worse.  Your e-visit answers were reviewed by a board certified advanced clinical practitioner to complete your personal care plan. Depending upon the condition, your plan could have included  both over the counter or prescription medications. Please review your pharmacy choice to make sure that you have choses a pharmacy that is open for you to pick up any needed prescription, Your safety is important to us. If you have drug allergies check your prescription carefully.   You can use MyChart to ask questions about today's visit, request a non-urgent call back, or ask for a work or school excuse for 24 hours related to this e-Visit. If it has been greater than 24 hours you will need to follow up with your provider, or enter a new e-Visit to address those concerns. You will get a MyChart message within the next two days asking about your experience. I hope that your e-visit has been valuable and will speed your recovery.  Greater than 5 minutes, yet less than 10 minutes of time have been spent researching, coordinating and implementing care for this patient today.   

## 2019-07-19 ENCOUNTER — Ambulatory Visit: Payer: BC Managed Care – PPO | Admitting: Internal Medicine

## 2019-09-04 ENCOUNTER — Telehealth: Payer: BC Managed Care – PPO | Admitting: Nurse Practitioner

## 2019-09-04 DIAGNOSIS — N760 Acute vaginitis: Secondary | ICD-10-CM

## 2019-09-04 NOTE — Progress Notes (Signed)
Based on what you shared with me it looks like you have vaginosis,that should be evaluated in a face to face office visit. According to your chart you have been treated for this on many occasions in th e;ast year. You need to see a gyn and see if they can figure out the cause of this and is it truly is BV or is something else going on.    NOTE: If you entered your credit card information for this eVisit, you will not be charged. You may see a "hold" on your card for the $35 but that hold will drop off and you will not have a charge processed.  If you are having a true medical emergency please call 911.     For an urgent face to face visit, [facility] has four urgent care centers for your convenience:   . [facility]    [phone]                  Get Driving Directions  [address] [place], [place] [address] . 10 am to 8 pm Monday-Friday . 12 pm to 8 pm Saturday-Sunday   . [facility] at [facility]  [phone]                  Get Driving Directions  [address] [place], [place] [address] . 8 am to 8 pm Monday-Friday . 9 am to 6 pm Saturday . 11 am to 6 pm Sunday   . [facility] at [facility]  [phone]                  Get Driving Directions   [address] [place], [place] [address] . 8 am to 8 pm Monday-Friday . 8 am to 4 pm Saturday-Sunday    . [facility] at [facility] Directions  [phone]  [address] [place], [place] [address]  . Monday-Friday, 12 PM to 6 PM    Your e-visit answers were reviewed by a board certified advanced clinical practitioner to complete your personal care plan.  Thank you for using e-Visits.

## 2020-03-09 ENCOUNTER — Telehealth: Payer: BC Managed Care – PPO | Admitting: Physician Assistant

## 2020-03-09 ENCOUNTER — Encounter: Payer: Self-pay | Admitting: Physician Assistant

## 2020-03-09 DIAGNOSIS — N760 Acute vaginitis: Secondary | ICD-10-CM

## 2020-03-09 MED ORDER — METRONIDAZOLE 500 MG PO TABS
500.0000 mg | ORAL_TABLET | Freq: Two times a day (BID) | ORAL | 0 refills | Status: DC
Start: 2020-03-09 — End: 2021-08-04

## 2020-03-09 NOTE — Progress Notes (Signed)

## 2021-01-07 ENCOUNTER — Other Ambulatory Visit (HOSPITAL_COMMUNITY)
Admission: RE | Admit: 2021-01-07 | Discharge: 2021-01-07 | Disposition: A | Discharge status: Home / Self Care | Payer: Medicaid Other | Source: Ambulatory Visit | Attending: Obstetrics | Admitting: Obstetrics

## 2021-01-07 ENCOUNTER — Ambulatory Visit (INDEPENDENT_AMBULATORY_CARE_PROVIDER_SITE_OTHER): Payer: Medicaid Other | Admitting: Obstetrics

## 2021-01-07 ENCOUNTER — Encounter: Payer: Self-pay | Admitting: Obstetrics

## 2021-01-07 ENCOUNTER — Other Ambulatory Visit: Payer: Self-pay

## 2021-01-07 VITALS — BP 111/80 | HR 99 | Ht 69.0 in | Wt 235.0 lb

## 2021-01-07 DIAGNOSIS — Z01419 Encounter for gynecological examination (general) (routine) without abnormal findings: Secondary | ICD-10-CM | POA: Diagnosis not present

## 2021-01-07 DIAGNOSIS — E669 Obesity, unspecified: Secondary | ICD-10-CM

## 2021-01-07 DIAGNOSIS — N946 Dysmenorrhea, unspecified: Secondary | ICD-10-CM

## 2021-01-07 DIAGNOSIS — N898 Other specified noninflammatory disorders of vagina: Secondary | ICD-10-CM | POA: Diagnosis present

## 2021-01-07 MED ORDER — IBUPROFEN 800 MG PO TABS: 800.0000 mg | ORAL_TABLET | Freq: Three times a day (TID) | ORAL | 11 refills | Status: DC | PRN

## 2021-01-07 MED ORDER — LO LOESTRIN FE 1 MG-10 MCG / 10 MCG PO TABS: 1.0000 | ORAL_TABLET | Freq: Every day | ORAL | 4 refills | Status: DC

## 2021-01-07 NOTE — Progress Notes (Signed)
Subjective:        Melissa Chan is a 30 y.o. female here for a routine exam.  Current complaints: Heavy and painful periods.  Had BTL in 2018 after fourth child, and periods worsened.  Personal health questionnaire:  Is patient Ashkenazi Jewish, have a family history of breast and/or ovarian cancer: no Is there a family history of uterine cancer diagnosed at age < 54, gastrointestinal cancer, urinary tract cancer, family member who is a Personnel officer syndrome-associated carrier: no Is the patient overweight and hypertensive, family history of diabetes, personal history of gestational diabetes, preeclampsia or PCOS: no Is patient over 6, have PCOS,  family history of premature CHD under age 94, diabetes, smoke, have hypertension or peripheral artery disease:  no At any time, has a partner hit, kicked or otherwise hurt or frightened you?: no Over the past 2 weeks, have you felt down, depressed or hopeless?: no Over the past 2 weeks, have you felt little interest or pleasure in doing things?:no   Gynecologic History Patient's last menstrual period was 12/25/2020 (exact date). Contraception: tubal ligation Last Pap: 08-25-2018. Results were: normal Last mammogram: n/a. Results were: n/a  Obstetric History OB History  Gravida Para Term Preterm AB Living  5 5 4 1   5   SAB IAB Ectopic Multiple Live Births        0 5    # Outcome Date GA Lbr Len/2nd Weight Sex Delivery Anes PTL Lv  5 Term 03/11/16 [redacted]w[redacted]d 01:29 8 lb 6.8 oz (3.822 kg) F Vag-Spont None  LIV  4 Term 01/14/14 [redacted]w[redacted]d 10:47 / 00:05 7 lb 8.8 oz (3.425 kg) F Vag-Spont None  LIV     Birth Comments: WNL  3 Term 03/17/12 [redacted]w[redacted]d  6 lb 13 oz (3.09 kg) M    LIV  2 Term 04/04/06 [redacted]w[redacted]d  8 lb 14 oz (4.026 kg) M Vag-Spont EPI  LIV  1 Preterm 02/27/05 [redacted]w[redacted]d  3 lb 14 oz (1.758 kg) F    LIV    Past Medical History:  Diagnosis Date   Medical history non-contributory     Past Surgical History:  Procedure Laterality Date   NO PAST  SURGERIES     TUBAL LIGATION Bilateral 03/12/2016   Procedure: POST PARTUM TUBAL LIGATION;  Surgeon: 03/14/2016, MD;  Location: [facility];  Service: Gynecology;  Laterality: Bilateral;     Current Outpatient Medications:    LO LOESTRIN FE 1 MG-10 MCG / 10 MCG tablet, Take 1 tablet by mouth daily., Disp: 84 tablet, Rfl: 4   clotrimazole (LOTRIMIN) 1 % cream, Apply 1 application topically 2 (two) times daily. (Patient not taking: Reported on 01/07/2021), Disp: 113 g, Rfl: 2   glucose blood (ACCU-CHEK GUIDE) test strip, 1x daily, Disp: 50 each, Rfl: 12   ibuprofen (ADVIL) 800 MG tablet, Take 1 tablet (800 mg total) by mouth every 8 (eight) hours as needed., Disp: 30 tablet, Rfl: 11   insulin degludec (TRESIBA FLEXTOUCH) 100 UNIT/ML FlexTouch Pen, Inject 0.2 mLs (20 Units total) into the skin daily., Disp: 15 mL, Rfl: 6   Insulin Pen Needle 32G X 4 MM MISC, 1 Device by Does not apply route daily., Disp: 50 each, Rfl: 3   metFORMIN (GLUCOPHAGE) 1000 MG tablet, Take 1 tablet (1,000 mg total) by mouth daily with breakfast., Disp: 90 tablet, Rfl: 1   metroNIDAZOLE (FLAGYL) 500 MG tablet, Take 1 tablet (500 mg total) by mouth 2 (two) times daily., Disp: 14 tablet, Rfl: 0  No Known Allergies  Social History   Tobacco Use   Smoking status: Never   Smokeless tobacco: Never  Substance Use Topics   Alcohol use: No    Family History  Problem Relation Age of Onset   Hypertension Mother    Hypertension Father    Diabetes Father    Cancer Maternal Grandmother    Heart disease Paternal Grandmother       Review of Systems  Constitutional: negative for fatigue and weight loss Respiratory: negative for cough and wheezing Cardiovascular: negative for chest pain, fatigue and palpitations Gastrointestinal: negative for abdominal pain and change in bowel habits Musculoskeletal:negative for myalgias Neurological: negative for gait problems and tremors Behavioral/Psych: negative for abusive relationship,  depression Endocrine: negative for temperature intolerance    Genitourinary:negative for abnormal menstrual periods, genital lesions, hot flashes, sexual problems and vaginal discharge Integument/breast: negative for breast lump, breast tenderness, nipple discharge and skin lesion(s)    Objective:       BP 111/80    Pulse 99    Ht 5\' 9"  (1.753 m)    Wt 235 lb (106.6 kg)    LMP 12/25/2020 (Exact Date)    BMI 34.70 kg/m  General:   alert  Skin:   no rash or abnormalities  Lungs:   clear to auscultation bilaterally  Heart:   regular rate and rhythm, S1, S2 normal, no murmur, click, rub or gallop  Breasts:   normal without suspicious masses, skin or nipple changes or axillary nodes  Abdomen:  normal findings: no organomegaly, soft, non-tender and no hernia  Pelvis:  External genitalia: normal general appearance Urinary system: urethral meatus normal and bladder without fullness, nontender Vaginal: normal without tenderness, induration or masses Cervix: normal appearance Adnexa: normal bimanual exam Uterus: anteverted and non-tender, normal size   Lab Review Urine pregnancy test Labs reviewed yes Radiologic studies reviewed no  I have spent a total of 20 minutes of face-to-face time, excluding clinical staff time, reviewing notes and preparing to see patient, ordering tests and/or medications, and counseling the patient.   Assessment:    1. Encounter for routine gynecological examination with Papanicolaou smear of cervix Rx: - Cytology - PAP( [place])  2. Vaginal discharge Rx: - Cervicovaginal ancillary only( [place])  3. Severe dysmenorrhea Rx: - LO LOESTRIN FE 1 MG-10 MCG / 10 MCG tablet; Take 1 tablet by mouth daily.  Dispense: 84 tablet; Refill: 4  4. Obesity (BMI 30.0-34.9) -  5. Dysmenorrhea Rx: - ibuprofen (ADVIL) 800 MG tablet; Take 1 tablet (800 mg total) by mouth every 8 (eight) hours as needed.  Dispense: 30 tablet; Refill: 11      Plan:     Education reviewed: calcium supplements, depression evaluation, low fat, low cholesterol diet, safe sex/STD prevention, self breast exams, and weight bearing exercise. Follow up in: 6 months. Lo Loestrin / Ibuprofen for Severe Dysmenorrhea  / AUB  Meds ordered this encounter  Medications   LO LOESTRIN FE 1 MG-10 MCG / 10 MCG tablet    Sig: Take 1 tablet by mouth daily.    Dispense:  84 tablet    Refill:  4    Submit other coverage code 3  BIN:  1994  PCN:  [id number]   GRP:  [id number]   ID:  [id number]   ibuprofen (ADVIL) 800 MG tablet    Sig: Take 1 tablet (800 mg total) by mouth every 8 (eight) hours as needed.    Dispense:  30 tablet  Refill:  11     Brock Bad, MD 01/07/2021 10:58 AM

## 2021-01-07 NOTE — Progress Notes (Signed)
GYN presents for painful 8/10, heavy periods x 2+ years.  She had a Tubal in 2018.

## 2021-01-08 LAB — CERVICOVAGINAL ANCILLARY ONLY
Bacterial Vaginitis (gardnerella): NEGATIVE
Candida Glabrata: POSITIVE — AB
Candida Vaginitis: NEGATIVE
Chlamydia: NEGATIVE
Comment: NEGATIVE
Comment: NEGATIVE
Comment: NEGATIVE
Comment: NEGATIVE
Comment: NEGATIVE
Comment: NORMAL
Neisseria Gonorrhea: NEGATIVE
Trichomonas: NEGATIVE

## 2021-01-09 ENCOUNTER — Other Ambulatory Visit: Payer: Self-pay | Admitting: Obstetrics

## 2021-01-09 DIAGNOSIS — B379 Candidiasis, unspecified: Secondary | ICD-10-CM

## 2021-01-09 MED ORDER — AZO BORIC ACID 600 MG VA SUPP: 1.0000 | Freq: Every day | VAGINAL | 0 refills | Status: AC

## 2021-01-09 MED ORDER — AZO BORIC ACID 600 MG VA SUPP: 1.0000 | Freq: Every day | VAGINAL | 0 refills | Status: DC

## 2021-01-12 LAB — CYTOLOGY - PAP
Comment: NEGATIVE
Diagnosis: NEGATIVE
Diagnosis: REACTIVE
High risk HPV: NEGATIVE

## 2021-08-04 ENCOUNTER — Telehealth: Payer: Medicaid Other | Admitting: Physician Assistant

## 2021-08-04 DIAGNOSIS — N898 Other specified noninflammatory disorders of vagina: Secondary | ICD-10-CM

## 2021-08-04 MED ORDER — FLUCONAZOLE 150 MG PO TABS: 150.0000 mg | ORAL_TABLET | Freq: Once | ORAL | 0 refills | Status: AC

## 2021-08-04 NOTE — Progress Notes (Signed)
I have spent 5 minutes in review of e-visit questionnaire, review and updating patient chart, medical decision making and response to patient.   Ashlan Dignan Cody Eugune Sine, PA-C    

## 2021-08-04 NOTE — Progress Notes (Signed)

## 2021-10-13 ENCOUNTER — Telehealth: Payer: Medicaid Other | Admitting: Family Medicine

## 2021-10-13 DIAGNOSIS — B3731 Acute candidiasis of vulva and vagina: Secondary | ICD-10-CM

## 2021-10-13 MED ORDER — FLUCONAZOLE 150 MG PO TABS: 150.0000 mg | ORAL_TABLET | Freq: Once | ORAL | 0 refills | Status: AC

## 2021-10-13 NOTE — Progress Notes (Signed)

## 2021-10-19 ENCOUNTER — Ambulatory Visit: Payer: Medicaid Other

## 2022-05-12 ENCOUNTER — Telehealth: Payer: Medicaid Other | Admitting: Nurse Practitioner

## 2022-05-12 DIAGNOSIS — B3731 Acute candidiasis of vulva and vagina: Secondary | ICD-10-CM | POA: Diagnosis not present

## 2022-05-12 MED ORDER — FLUCONAZOLE 150 MG PO TABS: 150.0000 mg | ORAL_TABLET | Freq: Once | ORAL | 0 refills | Status: AC

## 2022-05-12 NOTE — Progress Notes (Signed)

## 2022-06-30 ENCOUNTER — Telehealth: Payer: Medicaid Other | Admitting: Nurse Practitioner

## 2022-06-30 DIAGNOSIS — T3695XA Adverse effect of unspecified systemic antibiotic, initial encounter: Secondary | ICD-10-CM

## 2022-06-30 DIAGNOSIS — B379 Candidiasis, unspecified: Secondary | ICD-10-CM | POA: Diagnosis not present

## 2022-06-30 MED ORDER — FLUCONAZOLE 150 MG PO TABS: 150.0000 mg | ORAL_TABLET | Freq: Once | ORAL | 0 refills | Status: AC

## 2022-06-30 NOTE — Progress Notes (Signed)
Melissa Chan,  Thank you for submitting an e-visit request. We are happy to send in a Diflucan for antibiotic associated yeast infection treatment, but without symptoms I would not recommend treatment for BV at this time.   E-Visit for Vaginal Symptoms  We are sorry that you are not feeling well. Here is how we plan to help! Based on what you shared with me it looks like you: May have a yeast vaginosis  Vaginosis is an inflammation of the vagina that can result in discharge, itching and pain. The cause is usually a change in the normal balance of vaginal bacteria or an infection. Vaginosis can also result from reduced estrogen levels after menopause.  The most common causes of vaginosis are:   Bacterial vaginosis which results from an overgrowth of one on several organisms that are normally present in your vagina.   Yeast infections which are caused by a naturally occurring fungus called candida.   Vaginal atrophy (atrophic vaginosis) which results from the thinning of the vagina from reduced estrogen levels after menopause.   Trichomoniasis which is caused by a parasite and is commonly transmitted by sexual intercourse.  Factors that increase your risk of developing vaginosis include: Medications, such as antibiotics and steroids Uncontrolled diabetes Use of hygiene products such as bubble bath, vaginal spray or vaginal deodorant Douching Wearing damp or tight-fitting clothing Using an intrauterine device (IUD) for birth control Hormonal changes, such as those associated with pregnancy, birth control pills or menopause Sexual activity Having a sexually transmitted infection  Your treatment plan is A single Diflucan (fluconazole) 150mg  tablet once.  I have electronically sent this prescription into the pharmacy that you have chosen.  Be sure to take all of the medication as directed. Stop taking any medication if you develop a rash, tongue swelling or shortness of breath. Mothers who  are breast feeding should consider pumping and discarding their breast milk while on these antibiotics. However, there is no consensus that infant exposure at these doses would be harmful.  Remember that medication creams can weaken latex condoms. Marland Kitchen   HOME CARE:  Good hygiene may prevent some types of vaginosis from recurring and may relieve some symptoms:  Avoid baths, hot tubs and whirlpool spas. Rinse soap from your outer genital area after a shower, and dry the area well to prevent irritation. Don't use scented or harsh soaps, such as those with deodorant or antibacterial action. Avoid irritants. These include scented tampons and pads. Wipe from front to back after using the toilet. Doing so avoids spreading fecal bacteria to your vagina.  Other things that may help prevent vaginosis include:  Don't douche. Your vagina doesn't require cleansing other than normal bathing. Repetitive douching disrupts the normal organisms that reside in the vagina and can actually increase your risk of vaginal infection. Douching won't clear up a vaginal infection. Use a latex condom. Both female and female latex condoms may help you avoid infections spread by sexual contact. Wear cotton underwear. Also wear pantyhose with a cotton crotch. If you feel comfortable without it, skip wearing underwear to bed. Yeast thrives in [employer] Your symptoms should improve in the next day or two.  GET HELP RIGHT AWAY IF:  You have pain in your lower abdomen ( pelvic area or over your ovaries) You develop nausea or vomiting You develop a fever Your discharge changes or worsens You have persistent pain with intercourse You develop shortness of breath, a rapid pulse, or you faint.  These symptoms could  be signs of problems or infections that need to be evaluated by a medical provider now.  MAKE SURE YOU   Understand these instructions. Will watch your condition. Will get help right away if you are not  doing well or get worse.  Thank you for choosing an e-visit.  Your e-visit answers were reviewed by a board certified advanced clinical practitioner to complete your personal care plan. Depending upon the condition, your plan could have included both over the counter or prescription medications.  Please review your pharmacy choice. Make sure the pharmacy is open so you can pick up prescription now. If there is a problem, you may contact your provider through [employer] and have the prescription routed to another pharmacy.  Your safety is important to [place]. If you have drug allergies check your prescription carefully.   For the next 24 hours you can use MyChart to ask questions about today's visit, request a non-urgent call back, or ask for a work or school excuse. You will get an email in the next two days asking about your experience. I hope that your e-visit has been valuable and will speed your recovery.   Meds ordered this encounter  Medications   fluconazole (DIFLUCAN) 150 MG tablet    Sig: Take 1 tablet (150 mg total) by mouth once for 1 dose.    Dispense:  1 tablet    Refill:  0    I spent approximately 5 minutes reviewing the patient's history, current symptoms and coordinating their care today.

## 2022-08-24 ENCOUNTER — Telehealth: Payer: Medicaid Other | Admitting: Physician Assistant

## 2022-08-24 DIAGNOSIS — B379 Candidiasis, unspecified: Secondary | ICD-10-CM

## 2022-08-24 DIAGNOSIS — T3695XA Adverse effect of unspecified systemic antibiotic, initial encounter: Secondary | ICD-10-CM | POA: Diagnosis not present

## 2022-08-24 MED ORDER — FLUCONAZOLE 150 MG PO TABS: 150.0000 mg | ORAL_TABLET | ORAL | 0 refills | Status: DC

## 2022-08-24 NOTE — Progress Notes (Signed)
I have spent 5 minutes in review of e-visit questionnaire, review and updating patient chart, medical decision making and response to patient.   William Cody Martin, PA-C    

## 2022-08-24 NOTE — Progress Notes (Signed)

## 2023-03-05 ENCOUNTER — Encounter (HOSPITAL_BASED_OUTPATIENT_CLINIC_OR_DEPARTMENT_OTHER): Payer: Self-pay | Admitting: Emergency Medicine

## 2023-03-05 ENCOUNTER — Other Ambulatory Visit: Payer: Self-pay

## 2023-03-05 ENCOUNTER — Emergency Department (HOSPITAL_BASED_OUTPATIENT_CLINIC_OR_DEPARTMENT_OTHER)
Admission: EM | Admit: 2023-03-05 | Discharge: 2023-03-05 | Disposition: A | Discharge status: Home / Self Care | Payer: Medicaid Other | Attending: Emergency Medicine | Admitting: Emergency Medicine

## 2023-03-05 ENCOUNTER — Emergency Department (HOSPITAL_BASED_OUTPATIENT_CLINIC_OR_DEPARTMENT_OTHER): Payer: Medicaid Other

## 2023-03-05 DIAGNOSIS — E119 Type 2 diabetes mellitus without complications: Secondary | ICD-10-CM | POA: Diagnosis not present

## 2023-03-05 DIAGNOSIS — R059 Cough, unspecified: Secondary | ICD-10-CM | POA: Diagnosis not present

## 2023-03-05 DIAGNOSIS — Z794 Long term (current) use of insulin: Secondary | ICD-10-CM | POA: Diagnosis not present

## 2023-03-05 DIAGNOSIS — R091 Pleurisy: Secondary | ICD-10-CM | POA: Diagnosis not present

## 2023-03-05 DIAGNOSIS — R079 Chest pain, unspecified: Secondary | ICD-10-CM | POA: Diagnosis present

## 2023-03-05 DIAGNOSIS — R058 Other specified cough: Secondary | ICD-10-CM

## 2023-03-05 LAB — CBC WITH DIFFERENTIAL/PLATELET
Abs Immature Granulocytes: 0.01 10*3/uL (ref 0.00–0.07)
Basophils Absolute: 0 10*3/uL (ref 0.0–0.1)
Basophils Relative: 0 %
Eosinophils Absolute: 0.1 10*3/uL (ref 0.0–0.5)
Eosinophils Relative: 1 %
HCT: 37.6 % (ref 36.0–46.0)
Hemoglobin: 12.4 g/dL (ref 12.0–15.0)
Immature Granulocytes: 0 %
Lymphocytes Relative: 36 %
Lymphs Abs: 2.3 10*3/uL (ref 0.7–4.0)
MCH: 26.8 pg (ref 26.0–34.0)
MCHC: 33 g/dL (ref 30.0–36.0)
MCV: 81.2 fL (ref 80.0–100.0)
Monocytes Absolute: 0.5 10*3/uL (ref 0.1–1.0)
Monocytes Relative: 8 %
Neutro Abs: 3.6 10*3/uL (ref 1.7–7.7)
Neutrophils Relative %: 55 %
Platelets: 396 10*3/uL (ref 150–400)
RBC: 4.63 MIL/uL (ref 3.87–5.11)
RDW: 11.9 % (ref 11.5–15.5)
WBC: 6.5 10*3/uL (ref 4.0–10.5)
nRBC: 0 % (ref 0.0–0.2)

## 2023-03-05 LAB — TROPONIN I (HIGH SENSITIVITY): Troponin I (High Sensitivity): <2 ng/L (ref <18)

## 2023-03-05 LAB — BASIC METABOLIC PANEL
Anion gap: 10 (ref 5–15)
BUN: 11 mg/dL (ref 6–20)
CO2: 27 mmol/L (ref 22–32)
Calcium: 9.3 mg/dL (ref 8.9–10.3)
Chloride: 95 mmol/L — ABNORMAL LOW (ref 98–111)
Creatinine, Ser: 0.59 mg/dL (ref 0.44–1.00)
GFR, Estimated: >60 mL/min (ref >60)
Glucose, Bld: 419 mg/dL — ABNORMAL HIGH (ref 70–99)
Potassium: 3.9 mmol/L (ref 3.5–5.1)
Sodium: 132 mmol/L — ABNORMAL LOW (ref 135–145)

## 2023-03-05 LAB — PREGNANCY, URINE: Preg Test, Ur: NEGATIVE

## 2023-03-05 MED ORDER — BENZONATATE 100 MG PO CAPS
200.0000 mg | ORAL_CAPSULE | Freq: Once | ORAL | Status: AC
  Administered 2023-03-05: 200 mg via ORAL
  Filled 2023-03-05: qty 2

## 2023-03-05 MED ORDER — BENZONATATE 100 MG PO CAPS: 100.0000 mg | ORAL_CAPSULE | Freq: Three times a day (TID) | ORAL | 0 refills | Status: DC

## 2023-03-05 MED ORDER — INDOMETHACIN 25 MG PO CAPS: 25.0000 mg | ORAL_CAPSULE | Freq: Three times a day (TID) | ORAL | 0 refills | Status: AC | PRN

## 2023-03-05 NOTE — ED Triage Notes (Signed)
 Pt sts she had the flu a few weeks ago; c/o chest pain since around 1/30; sts it is her entire chest and goes around to back; persistent cough since 1/30

## 2023-03-05 NOTE — Discharge Instructions (Addendum)
 You were seen in the ER today with concerns of chest pain. Your labs and imaging were thankfully negative without any clear abnormal findings seen. I suspect this is likely pleurisy. I have sent a prescription to help with this pain as well as the cough you are still experiencing. I would recommend returning to the ER for any new or worsening symptoms.

## 2023-03-05 NOTE — ED Provider Notes (Signed)
 [place] [facility] Provider Note   CSN: [id number] Arrival date & time: 03/05/23  1428     History Chief Complaint  Patient presents with   Chest Pain    Melissa Chan is a 33 y.o. female.  Patient without significant medical history presents the emergency department concerns of chest pain.  She reports that she was diagnosed with flu several weeks ago and began to experience some generalized chest pain on 1/30.  The pain is not entirely resolved and still feels that she is having chest pain throughout her entire chest.  States that she is also having a persistent cough since being diagnosed with flu.  Feels that she is not getting relief with her current cough medication.  No recent fever, chills or bodyaches.  Denies any shortness of breath.   Chest Pain      Home Medications Prior to Admission medications   Medication Sig Start Date End Date Taking? Authorizing Provider  benzonatate  (TESSALON ) 100 MG capsule Take 1 capsule (100 mg total) by mouth every 8 (eight) hours. 03/05/23  Yes Laron Angelini A, PA-C  indomethacin  (INDOCIN ) 25 MG capsule Take 1 capsule (25 mg total) by mouth 3 (three) times daily as needed for up to 10 days. 03/05/23 03/15/23 Yes Dajon Lazar A, PA-C  Boric Acid Vaginal (AZO BORIC ACID) 600 MG SUPP Place 1 suppository vaginally at bedtime. 01/09/21   Rudy Carlin LABOR, MD  fluconazole  (DIFLUCAN ) 150 MG tablet Take 1 tablet (150 mg total) by mouth every 3 (three) days. Take 1 tablet PO once. Repeat in 3 days if needed. 08/24/22   Gladis Elsie BROCKS, PA-C  ibuprofen  (ADVIL ) 800 MG tablet Take 1 tablet (800 mg total) by mouth every 8 (eight) hours as needed. 01/07/21   Rudy Carlin LABOR, MD  insulin  degludec (TRESIBA  FLEXTOUCH) 100 UNIT/ML FlexTouch Pen Inject 0.2 mLs (20 Units total) into the skin daily. 05/23/19   Shamleffer, Ibtehal Jaralla, MD  Insulin  Pen Needle 32G X 4 MM MISC 1 Device by Does not apply route daily. 05/23/19    Shamleffer, Ibtehal Jaralla, MD  LO LOESTRIN FE  1 MG-10 MCG / 10 MCG tablet Take 1 tablet by mouth daily. 01/07/21   Rudy Carlin LABOR, MD      Allergies    Patient has no known allergies.    Review of Systems   Review of Systems  Cardiovascular:  Positive for chest pain.  All other systems reviewed and are negative.   Physical Exam Updated Vital Signs BP 113/87   Pulse (!) 108   Temp 98 F (36.7 C) (Oral)   Resp 14   Ht 5' 9 (1.753 m)   Wt 93.9 kg   LMP 02/13/2023   SpO2 100%   BMI 30.57 kg/m  Physical Exam Vitals and nursing note reviewed.  Constitutional:      General: She is not in acute distress.    Appearance: She is well-developed.  HENT:     Head: Normocephalic and atraumatic.  Eyes:     Conjunctiva/sclera: Conjunctivae normal.  Cardiovascular:     Rate and Rhythm: Normal rate and regular rhythm.     Heart sounds: No murmur heard. Pulmonary:     Effort: Pulmonary effort is normal. No respiratory distress.     Breath sounds: Normal breath sounds. No decreased breath sounds, wheezing, rhonchi or rales.  Abdominal:     Palpations: Abdomen is soft.     Tenderness: There is no abdominal tenderness.  Musculoskeletal:        General: No swelling.     Cervical back: Neck supple.  Skin:    General: Skin is warm and dry.     Capillary Refill: Capillary refill takes less than 2 seconds.  Neurological:     Mental Status: She is alert.  Psychiatric:        Mood and Affect: Mood normal.     ED Results / Procedures / Treatments   Labs (all labs ordered are listed, but only abnormal results are displayed) Labs Reviewed  BASIC METABOLIC PANEL - Abnormal; Notable for the following components:      Result Value   Sodium 132 (*)    Chloride 95 (*)    Glucose, Bld 419 (*)    All other components within normal limits  PREGNANCY, URINE  CBC WITH DIFFERENTIAL/PLATELET  TROPONIN I (HIGH SENSITIVITY)    EKG None  Radiology DG Chest 2 View Result Date:  03/05/2023 CLINICAL DATA:  Influenza several weeks ago, chest pain for greater than 1 week, pain radiating to back EXAM: CHEST - 2 VIEW COMPARISON:  12/13/2014 FINDINGS: The heart size and mediastinal contours are within normal limits. Both lungs are clear. The visualized skeletal structures are unremarkable. IMPRESSION: No active cardiopulmonary disease. Electronically Signed   By: Ozell Daring M.D.   On: 03/05/2023 15:25    Procedures Procedures   Medications Ordered in ED Medications  benzonatate  (TESSALON ) capsule 200 mg (200 mg Oral Given 03/05/23 1649)    ED Course/ Medical Decision Making/ A&P                                 Medical Decision Making Amount and/or Complexity of Data Reviewed Labs: ordered. Radiology: ordered.  Risk Prescription drug management.   This patient presents to the ED for concern of chest pain.  Differential diagnosis includes pleurisy, costochondritis, ACS, PE   Lab Tests:  I Ordered, and personally interpreted labs.  The pertinent results include: CBC unremarkable, BMP with mild electrolyte changes indicating minimal dehydration, urine pregnancy negative, troponin negative at less than 2   Imaging Studies ordered:  I ordered imaging studies including chest x-ray I independently visualized and interpreted imaging which showed negative for any acute cardiopulmonary findings I agree with the radiologist interpretation   Medicines ordered and prescription drug management:  I ordered medication including Tessalon  for cough Reevaluation of the patient after these medicines showed that the patient improved I have reviewed the patients home medicines and have made adjustments as needed   Problem List / ED Course:  Patient presents emergency department concerns of chest pain.  No sick the medical history beyond type 2 diabetes.  She reports she was diagnosed with influenza on 02/24/2023.  Since then, has had continual cough without significant  treatment with the promethazine  dextromethorphan medication she was prescribed.  She states that she is having worsening pain with occasional deep inhalation and with coughing.  States that she is able to ambulate her without significant chest discomfort.  Denies any prior cardiac history. Exam reveals clear lung sounds bilaterally no new murmur.  Some pain with palpation over the chest wall bilaterally and the sternum.  Deep inhalation with some compression present worsens pain diffusely. Lab workup is unremarkable.  Mild dehydration possible.  No acute findings suggest ACS as troponin is less than 2 and EKG is unremarkable.  Chest x-ray negative for any acute findings. Patient  is PERC negative. Given findings of workup, I suspect her symptoms are likely due to pleuritic type chest pain and this is highly likely given recently diagnosed with influenza on 02/23/2023.  In combination of these findings, will prescribe patient a short course of indomethacin  as well as Tessalon  for management of her symptoms.  Discussed return precautions.  Otherwise encourage patient to follow-up with PCP.  Patient discharged home in stable condition.   Final Clinical Impression(s) / ED Diagnoses Final diagnoses:  Pleurisy  Post-viral cough syndrome    Rx / DC Orders ED Discharge Orders          Ordered    indomethacin  (INDOCIN ) 25 MG capsule  3 times daily PRN        03/05/23 1824    benzonatate  (TESSALON ) 100 MG capsule  Every 8 hours        03/05/23 1824              Haydn Cush A, PA-C 03/05/23 1829    Nicholaus Cassondra DEL, MD 03/06/23 1501

## 2023-04-16 ENCOUNTER — Other Ambulatory Visit: Payer: Self-pay

## 2023-04-16 ENCOUNTER — Emergency Department (HOSPITAL_BASED_OUTPATIENT_CLINIC_OR_DEPARTMENT_OTHER)

## 2023-04-16 ENCOUNTER — Emergency Department (HOSPITAL_BASED_OUTPATIENT_CLINIC_OR_DEPARTMENT_OTHER)
Admission: EM | Admit: 2023-04-16 | Discharge: 2023-04-16 | Disposition: A | Discharge status: Home / Self Care | Attending: Emergency Medicine | Admitting: Emergency Medicine

## 2023-04-16 ENCOUNTER — Encounter (HOSPITAL_BASED_OUTPATIENT_CLINIC_OR_DEPARTMENT_OTHER): Payer: Self-pay | Admitting: Urology

## 2023-04-16 DIAGNOSIS — E1165 Type 2 diabetes mellitus with hyperglycemia: Secondary | ICD-10-CM | POA: Diagnosis not present

## 2023-04-16 DIAGNOSIS — R102 Pelvic and perineal pain: Secondary | ICD-10-CM | POA: Diagnosis present

## 2023-04-16 DIAGNOSIS — N920 Excessive and frequent menstruation with regular cycle: Secondary | ICD-10-CM | POA: Diagnosis not present

## 2023-04-16 DIAGNOSIS — N946 Dysmenorrhea, unspecified: Secondary | ICD-10-CM | POA: Diagnosis not present

## 2023-04-16 DIAGNOSIS — Z794 Long term (current) use of insulin: Secondary | ICD-10-CM | POA: Diagnosis not present

## 2023-04-16 HISTORY — DX: Type 2 diabetes mellitus without complications: E11.9

## 2023-04-16 LAB — COMPREHENSIVE METABOLIC PANEL
ALT: 20 U/L (ref 0–44)
AST: 16 U/L (ref 15–41)
Albumin: 3.6 g/dL (ref 3.5–5.0)
Alkaline Phosphatase: 71 U/L (ref 38–126)
Anion gap: 8 (ref 5–15)
BUN: 12 mg/dL (ref 6–20)
CO2: 23 mmol/L (ref 22–32)
Calcium: 8.7 mg/dL — ABNORMAL LOW (ref 8.9–10.3)
Chloride: 102 mmol/L (ref 98–111)
Creatinine, Ser: 0.62 mg/dL (ref 0.44–1.00)
GFR, Estimated: >60 mL/min (ref >60)
Glucose, Bld: 312 mg/dL — ABNORMAL HIGH (ref 70–99)
Potassium: 3.5 mmol/L (ref 3.5–5.1)
Sodium: 133 mmol/L — ABNORMAL LOW (ref 135–145)
Total Bilirubin: 0.8 mg/dL (ref 0.0–1.2)
Total Protein: 7.3 g/dL (ref 6.5–8.1)

## 2023-04-16 LAB — HCG, QUANTITATIVE, PREGNANCY: hCG, Beta Chain, Quant, S: <1 m[IU]/mL (ref <5)

## 2023-04-16 LAB — CBC
HCT: 39.8 % (ref 36.0–46.0)
Hemoglobin: 13.1 g/dL (ref 12.0–15.0)
MCH: 27.2 pg (ref 26.0–34.0)
MCHC: 32.9 g/dL (ref 30.0–36.0)
MCV: 82.6 fL (ref 80.0–100.0)
Platelets: 319 10*3/uL (ref 150–400)
RBC: 4.82 MIL/uL (ref 3.87–5.11)
RDW: 12.4 % (ref 11.5–15.5)
WBC: 5.1 10*3/uL (ref 4.0–10.5)
nRBC: 0 % (ref 0.0–0.2)

## 2023-04-16 MED ORDER — ONDANSETRON HCL 4 MG/2ML IJ SOLN: 4.0000 mg | Freq: Once | INTRAMUSCULAR | Status: DC

## 2023-04-16 MED ORDER — KETOROLAC TROMETHAMINE 60 MG/2ML IM SOLN
60.0000 mg | Freq: Once | INTRAMUSCULAR | Status: AC
  Administered 2023-04-16: 60 mg via INTRAMUSCULAR
  Filled 2023-04-16: qty 2

## 2023-04-16 MED ORDER — ONDANSETRON 4 MG PO TBDP
4.0000 mg | ORAL_TABLET | Freq: Once | ORAL | Status: AC
  Administered 2023-04-16: 4 mg via ORAL
  Filled 2023-04-16: qty 1

## 2023-04-16 MED ORDER — KETOROLAC TROMETHAMINE 30 MG/ML IJ SOLN: 30.0000 mg | Freq: Once | INTRAMUSCULAR | Status: DC

## 2023-04-16 MED ORDER — NAPROXEN 500 MG PO TABS: 500.0000 mg | ORAL_TABLET | Freq: Two times a day (BID) | ORAL | 0 refills | Status: DC

## 2023-04-16 NOTE — Discharge Instructions (Signed)
 Your US shows changes that we can see with fibroids without discreet fibroid measured. Recommend follow up with GYN. If you develop worsening symptoms, fevers, other concerns please return to the ED. Happy early birthday !

## 2023-04-16 NOTE — ED Notes (Signed)

## 2023-04-16 NOTE — ED Notes (Signed)
 Patient transported to Ultrasound

## 2023-04-16 NOTE — ED Triage Notes (Signed)
 Pt states worsening pain with menstrual cycle that started at 0300 States heavy bleeding and passing clots  Also states nausea and not wanting to eat    H/o tubal ligation

## 2023-04-16 NOTE — ED Provider Notes (Signed)
  [place] [facility] Provider Note   CSN: [id number] Arrival date & time: 04/16/23  1604     History {Add pertinent medical, surgical, social history, OB history to HPI:1} Chief Complaint  Patient presents with   Vaginal Bleeding    Melissa Chan is a 33 y.o. female.  HPI      Stabbing left pelvis pain, vaginal bleeding, clots  Home Medications Prior to Admission medications   Medication Sig Start Date End Date Taking? Authorizing Provider  benzonatate (TESSALON) 100 MG capsule Take 1 capsule (100 mg total) by mouth every 8 (eight) hours. 03/05/23   Smitty Knudsen, PA-C  Boric Acid Vaginal (AZO BORIC ACID) 600 MG SUPP Place 1 suppository vaginally at bedtime. 01/09/21   Brock Bad, MD  fluconazole (DIFLUCAN) 150 MG tablet Take 1 tablet (150 mg total) by mouth every 3 (three) days. Take 1 tablet PO once. Repeat in 3 days if needed. 08/24/22   Waldon Merl, PA-C  ibuprofen (ADVIL) 800 MG tablet Take 1 tablet (800 mg total) by mouth every 8 (eight) hours as needed. 01/07/21   Brock Bad, MD  insulin degludec (TRESIBA FLEXTOUCH) 100 UNIT/ML FlexTouch Pen Inject 0.2 mLs (20 Units total) into the skin daily. 05/23/19   Shamleffer, Konrad Dolores, MD  Insulin Pen Needle 32G X 4 MM MISC 1 Device by Does not apply route daily. 05/23/19   Shamleffer, Konrad Dolores, MD  LO LOESTRIN FE 1 MG-10 MCG / 10 MCG tablet Take 1 tablet by mouth daily. 01/07/21   Brock Bad, MD      Allergies    Patient has no known allergies.    Review of Systems   Review of Systems  Physical Exam Updated Vital Signs BP 112/78 (BP Location: Left Arm)   Pulse (!) 111   Temp 98.4 F (36.9 C) (Oral)   Resp 18   Ht 5\' 9"  (1.753 m)   Wt 93.9 kg   LMP 04/16/2023   SpO2 97%   BMI 30.57 kg/m  Physical Exam  ED Results / Procedures / Treatments   Labs (all labs ordered are listed, but only abnormal results are displayed) Labs Reviewed   CBC  COMPREHENSIVE METABOLIC PANEL  HCG, QUANTITATIVE, PREGNANCY    EKG None  Radiology No results found.  Procedures Procedures  {Document cardiac monitor, telemetry assessment procedure when appropriate:1}  Medications Ordered in ED Medications - No data to display  ED Course/ Medical Decision Making/ A&P   {   Click here for ABCD2, HEART and other calculatorsREFRESH Note before signing :1}                              Medical Decision Making Amount and/or Complexity of Data Reviewed Labs: ordered.   ***  {Document critical care time when appropriate:1} {Document review of labs and clinical decision tools ie heart score, Chads2Vasc2 etc:1}  {Document your independent review of radiology images, and any outside records:1} {Document your discussion with family members, caretakers, and with consultants:1} {Document social determinants of health affecting pt's care:1} {Document your decision making why or why not admission, treatments were needed:1} Final Clinical Impression(s) / ED Diagnoses Final diagnoses:  None    Rx / DC Orders ED Discharge Orders     None

## 2023-07-12 ENCOUNTER — Telehealth: Admitting: Physician Assistant

## 2023-07-12 DIAGNOSIS — B3731 Acute candidiasis of vulva and vagina: Secondary | ICD-10-CM

## 2023-07-12 MED ORDER — FLUCONAZOLE 150 MG PO TABS: 150.0000 mg | ORAL_TABLET | ORAL | 0 refills | Status: DC | PRN

## 2023-07-12 NOTE — Progress Notes (Signed)

## 2023-09-26 NOTE — Progress Notes (Unsigned)
 History:  [name] is a 33 y.o. Melissa Chan who presents to clinic today for annual exam and problem visit with following issues:  Has menorrhagia - heavy the first few days. Lots of fatigue. Periods last 4 days.  Painful periods - reports it impacts her ADLS She was recommended to take ibuprofen  starting a day before period and continue q6h. This has not been working for her.  Was seen in ED 04/16/23 and diagnosed with fibroids, she has a positive family history. She is interested in medication. LMP 09/02/23.  Her current method of contraception is BTL.   The following portions of the patient's history were reviewed and updated as appropriate: allergies, current medications, family history, past medical history, social history, past surgical history and problem list.  Review of Systems:  Review of Systems  Constitutional:  Positive for malaise/fatigue. Negative for chills, fever and weight loss.       Fatigue with onset of period.   Respiratory:  Negative for cough, hemoptysis, shortness of breath and wheezing.   Cardiovascular:  Negative for chest pain and palpitations.  Gastrointestinal:  Negative for abdominal pain, constipation, diarrhea, nausea and vomiting.  Genitourinary:  Negative for dysuria, frequency, hematuria and urgency.  Skin:  Negative for rash.  Psychiatric/Behavioral:  Negative for depression, hallucinations and suicidal ideas. The patient is not nervous/anxious.       Objective:  Physical Exam There were no vitals taken for this visit. Physical Exam Vitals and nursing note reviewed. Exam conducted with a chaperone present.  Constitutional:      Appearance: Normal appearance. She is normal weight.  HENT:     Head: Normocephalic.  Cardiovascular:     Rate and Rhythm: Normal rate and regular rhythm.     Pulses: Normal pulses.     Heart sounds: Normal heart sounds.  Pulmonary:     Effort: Pulmonary effort is normal.     Breath sounds: Normal breath  sounds.  Abdominal:     General: Abdomen is flat.  Genitourinary:    General: Normal vulva.     Exam position: Lithotomy position.     Labia:        Right: No rash or tenderness.        Left: No rash or tenderness.      Vagina: Normal.     Cervix: Normal. No discharge, lesion or cervical bleeding.     Comments: Speculum exam completed with chaperone present. No abnormalities noted, pap specimen obtained.  Musculoskeletal:        General: Normal range of motion.  Skin:    General: Skin is warm and dry.  Neurological:     Mental Status: She is alert and oriented to person, place, and time.  Psychiatric:        Mood and Affect: Mood normal.        Behavior: Behavior normal.        Thought Content: Thought content normal.        Judgment: Judgment normal.      Labs and Imaging No results found for this or any previous visit (from the past 24 hours).  No results found.   Assessment & Plan:  1. Well woman exam (Primary) PE WNL  2. Cervical cancer screening Pap with HPV cotesting today.   3. Well woman exam with routine gynecological exam 4. Dysmenorrhea 5. Menorrhagia with regular cycle - After SDM discussion - Will trial Junel Fe for control of pain and flow. Try continuous dosing for  3-6 months. Would recommend visit with OB/Gyn to evaluate for surgery of fibroids PRN.  - Discussed Lupron to shrink fibroids as an option, but that this would be better if surgery was currently her preferred route.       Regino Camie LABOR, CNM 09/26/2023 5:31 PM

## 2023-09-27 ENCOUNTER — Ambulatory Visit (INDEPENDENT_AMBULATORY_CARE_PROVIDER_SITE_OTHER): Admitting: Certified Nurse Midwife

## 2023-09-27 ENCOUNTER — Other Ambulatory Visit (HOSPITAL_COMMUNITY)
Admission: RE | Admit: 2023-09-27 | Discharge: 2023-09-27 | Disposition: A | Discharge status: Home / Self Care | Source: Ambulatory Visit | Attending: Certified Nurse Midwife | Admitting: Certified Nurse Midwife

## 2023-09-27 VITALS — BP 110/76 | HR 96 | Ht 69.0 in | Wt 195.0 lb

## 2023-09-27 DIAGNOSIS — Z1331 Encounter for screening for depression: Secondary | ICD-10-CM

## 2023-09-27 DIAGNOSIS — Z124 Encounter for screening for malignant neoplasm of cervix: Secondary | ICD-10-CM | POA: Insufficient documentation

## 2023-09-27 DIAGNOSIS — N946 Dysmenorrhea, unspecified: Secondary | ICD-10-CM

## 2023-09-27 DIAGNOSIS — N920 Excessive and frequent menstruation with regular cycle: Secondary | ICD-10-CM | POA: Diagnosis not present

## 2023-09-27 DIAGNOSIS — Z01419 Encounter for gynecological examination (general) (routine) without abnormal findings: Secondary | ICD-10-CM | POA: Diagnosis not present

## 2023-09-27 MED ORDER — NORETHIN ACE-ETH ESTRAD-FE 1-20 MG-MCG PO TABS: 1.0000 | ORAL_TABLET | Freq: Every day | ORAL | 13 refills | Status: AC

## 2023-09-30 LAB — CYTOLOGY - PAP
Adequacy: ABSENT
Comment: NEGATIVE
Diagnosis: NEGATIVE
High risk HPV: NEGATIVE

## 2023-10-08 ENCOUNTER — Ambulatory Visit: Payer: Self-pay | Admitting: Certified Nurse Midwife

## 2024-02-03 ENCOUNTER — Telehealth: Admitting: Physician Assistant

## 2024-02-03 DIAGNOSIS — B3731 Acute candidiasis of vulva and vagina: Secondary | ICD-10-CM

## 2024-02-03 MED ORDER — FLUCONAZOLE 150 MG PO TABS: 150.0000 mg | ORAL_TABLET | ORAL | 0 refills | Status: AC | PRN

## 2024-02-03 NOTE — Progress Notes (Signed)

## 2024-02-23 ENCOUNTER — Emergency Department (HOSPITAL_BASED_OUTPATIENT_CLINIC_OR_DEPARTMENT_OTHER)

## 2024-02-23 ENCOUNTER — Encounter (HOSPITAL_BASED_OUTPATIENT_CLINIC_OR_DEPARTMENT_OTHER): Payer: Self-pay

## 2024-02-23 ENCOUNTER — Emergency Department (HOSPITAL_BASED_OUTPATIENT_CLINIC_OR_DEPARTMENT_OTHER)
Admission: EM | Admit: 2024-02-23 | Discharge: 2024-02-23 | Disposition: A | Discharge status: Home / Self Care | Attending: Emergency Medicine | Admitting: Emergency Medicine

## 2024-02-23 ENCOUNTER — Other Ambulatory Visit: Payer: Self-pay

## 2024-02-23 DIAGNOSIS — R112 Nausea with vomiting, unspecified: Secondary | ICD-10-CM

## 2024-02-23 DIAGNOSIS — N3090 Cystitis, unspecified without hematuria: Secondary | ICD-10-CM | POA: Diagnosis not present

## 2024-02-23 DIAGNOSIS — R109 Unspecified abdominal pain: Secondary | ICD-10-CM | POA: Diagnosis present

## 2024-02-23 DIAGNOSIS — R1032 Left lower quadrant pain: Secondary | ICD-10-CM

## 2024-02-23 LAB — CBC WITH DIFFERENTIAL/PLATELET
Abs Immature Granulocytes: 0.07 10*3/uL (ref 0.00–0.07)
Basophils Absolute: 0 10*3/uL (ref 0.0–0.1)
Basophils Relative: 0 %
Eosinophils Absolute: 0 10*3/uL (ref 0.0–0.5)
Eosinophils Relative: 0 %
HCT: 38.5 % (ref 36.0–46.0)
Hemoglobin: 12.5 g/dL (ref 12.0–15.0)
Immature Granulocytes: 1 %
Lymphocytes Relative: 9 %
Lymphs Abs: 1.1 10*3/uL (ref 0.7–4.0)
MCH: 27.1 pg (ref 26.0–34.0)
MCHC: 32.5 g/dL (ref 30.0–36.0)
MCV: 83.5 fL (ref 80.0–100.0)
Monocytes Absolute: 0.4 10*3/uL (ref 0.1–1.0)
Monocytes Relative: 4 %
Neutro Abs: 10.4 10*3/uL — ABNORMAL HIGH (ref 1.7–7.7)
Neutrophils Relative %: 86 %
Platelets: 362 10*3/uL (ref 150–400)
RBC: 4.61 MIL/uL (ref 3.87–5.11)
RDW: 12.4 % (ref 11.5–15.5)
WBC: 12 10*3/uL — ABNORMAL HIGH (ref 4.0–10.5)
nRBC: 0 % (ref 0.0–0.2)

## 2024-02-23 LAB — COMPREHENSIVE METABOLIC PANEL WITH GFR
ALT: 14 U/L (ref 0–44)
AST: 11 U/L — ABNORMAL LOW (ref 15–41)
Albumin: 4.3 g/dL (ref 3.5–5.0)
Alkaline Phosphatase: 91 U/L (ref 38–126)
Anion gap: 13 (ref 5–15)
BUN: 8 mg/dL (ref 6–20)
CO2: 27 mmol/L (ref 22–32)
Calcium: 9.9 mg/dL (ref 8.9–10.3)
Chloride: 99 mmol/L (ref 98–111)
Creatinine, Ser: 0.72 mg/dL (ref 0.44–1.00)
GFR, Estimated: >60 mL/min
Glucose, Bld: 315 mg/dL — ABNORMAL HIGH (ref 70–99)
Potassium: 4.6 mmol/L (ref 3.5–5.1)
Sodium: 138 mmol/L (ref 135–145)
Total Bilirubin: 0.9 mg/dL (ref 0.0–1.2)
Total Protein: 7.9 g/dL (ref 6.5–8.1)

## 2024-02-23 LAB — LIPASE, BLOOD: Lipase: 21 U/L (ref 11–51)

## 2024-02-23 LAB — URINALYSIS, MICROSCOPIC (REFLEX)

## 2024-02-23 LAB — URINALYSIS, ROUTINE W REFLEX MICROSCOPIC
Bilirubin Urine: NEGATIVE
Glucose, UA: >=500 mg/dL — AB
Ketones, ur: >=80 mg/dL — AB
Leukocytes,Ua: NEGATIVE
Nitrite: POSITIVE — AB
Protein, ur: 30 mg/dL — AB
Specific Gravity, Urine: 1.025 (ref 1.005–1.030)
pH: 6 (ref 5.0–8.0)

## 2024-02-23 LAB — HCG, SERUM, QUALITATIVE: Preg, Serum: NEGATIVE

## 2024-02-23 MED ORDER — KETOROLAC TROMETHAMINE 15 MG/ML IJ SOLN
15.0000 mg | Freq: Once | INTRAMUSCULAR | Status: AC
  Administered 2024-02-23: 15 mg via INTRAVENOUS
  Filled 2024-02-23: qty 1

## 2024-02-23 MED ORDER — ONDANSETRON HCL 4 MG/2ML IJ SOLN
4.0000 mg | Freq: Once | INTRAMUSCULAR | Status: AC
  Administered 2024-02-23: 4 mg via INTRAVENOUS
  Filled 2024-02-23: qty 2

## 2024-02-23 MED ORDER — NITROFURANTOIN MONOHYD MACRO 100 MG PO CAPS: 100.0000 mg | ORAL_CAPSULE | Freq: Two times a day (BID) | ORAL | 0 refills | Status: AC

## 2024-02-23 MED ORDER — ONDANSETRON 4 MG PO TBDP: 4.0000 mg | ORAL_TABLET | Freq: Three times a day (TID) | ORAL | 0 refills | Status: AC | PRN

## 2024-02-23 MED ORDER — SODIUM CHLORIDE 0.9 % IV BOLUS
1000.0000 mL | Freq: Once | INTRAVENOUS | Status: AC
  Administered 2024-02-23: 1000 mL via INTRAVENOUS

## 2024-02-23 MED ORDER — NAPROXEN 500 MG PO TABS: 500.0000 mg | ORAL_TABLET | Freq: Two times a day (BID) | ORAL | 0 refills | Status: AC

## 2024-02-23 MED ORDER — IOHEXOL 300 MG/ML  SOLN
75.0000 mL | Freq: Once | INTRAMUSCULAR | Status: AC | PRN
  Administered 2024-02-23: 75 mL via INTRAVENOUS

## 2024-02-23 NOTE — ED Notes (Signed)

## 2024-02-23 NOTE — Discharge Instructions (Signed)
 Please read and follow all provided instructions.  Your diagnoses today include:  1. Left lower quadrant abdominal pain   2. Nausea and vomiting, unspecified vomiting type   3. Cystitis     Tests performed today include: Complete blood cell count: Your white blood cell count was high Complete metabolic panel: Normal liver and kidney function Lipase (pancreas function test): Normal pancreas function Urinalysis (urine test): Shows question of urinary tract infection Pregnancy test (urine or blood, in women only): Pregnancy was negative CT scan of the abdomen pelvis: Did not show any concerning findings in the left lower quadrant which would explain your pain, does show a few gallstones which are not causing any problems Vital signs. See below for your results today.   Medications prescribed:  Zofran  (ondansetron ) - for nausea and vomiting  Macrobid  - antibiotic for urinary tract infection  You have been prescribed an antibiotic medicine: take the entire course of medicine even if you are feeling better. Stopping early can cause the antibiotic not to work.  Naproxen  - anti-inflammatory pain medication Do not exceed 500mg  naproxen  every 12 hours, take with food  You have been prescribed an anti-inflammatory medication or NSAID. Take with food. Take smallest effective dose for the shortest duration needed for your pain. Stop taking if you experience stomach pain or vomiting.   Take any prescribed medications only as directed.  Home care instructions:  Follow any educational materials contained in this packet.  Follow-up instructions: Please follow-up with your primary care provider in the next 3 days for further evaluation of your symptoms.    Return instructions:  SEEK IMMEDIATE MEDICAL ATTENTION IF: The pain does not go away or becomes severe  A temperature above 101F develops  Repeated vomiting occurs (multiple episodes)  The pain becomes localized to portions of the abdomen.  The right side could possibly be appendicitis. In an adult, the left lower portion of the abdomen could be colitis or diverticulitis.  Blood is being passed in stools or vomit (bright red or black tarry stools)  You develop chest pain, difficulty breathing, dizziness or fainting, or become confused, poorly responsive, or inconsolable (young children) If you have any other emergent concerns regarding your health  Additional Information: Abdominal (belly) pain can be caused by many things. Your caregiver performed an examination and possibly ordered blood/urine tests and imaging (CT scan, x-rays, ultrasound). Many cases can be observed and treated at home after initial evaluation in the emergency department. Even though you are being discharged home, abdominal pain can be unpredictable. Therefore, you need a repeated exam if your pain does not resolve, returns, or worsens. Most patients with abdominal pain don't have to be admitted to the hospital or have surgery, but serious problems like appendicitis and gallbladder attacks can start out as nonspecific pain. Many abdominal conditions cannot be diagnosed in one visit, so follow-up evaluations are very important.  Your vital signs today were: BP (!) 155/97   Pulse 80   Temp 97.9 F (36.6 C) (Oral)   Resp 18   Ht 5' 9 (1.753 m)   Wt 90.7 kg   LMP 02/23/2024 (Exact Date)   SpO2 100%   BMI 29.53 kg/m  If your blood pressure (bp) was elevated above 135/85 this visit, please have this repeated by your doctor within one month. --------------

## 2024-02-23 NOTE — ED Triage Notes (Signed)
 Pt currently on menstrual cycle, but having vomiting since Tuesday night. Can't keep anything down. Generalized lower abdominal pain.
